# Patient Record
Sex: Female | Born: 1980
Health system: Southern US, Community
[De-identification: ages and names within clinical notes are randomized; demographics above are authoritative.]

## PROBLEM LIST (undated history)

## (undated) DIAGNOSIS — D649 Anemia, unspecified: Secondary | ICD-10-CM

## (undated) DIAGNOSIS — N912 Amenorrhea, unspecified: Secondary | ICD-10-CM

## (undated) DIAGNOSIS — G43909 Migraine, unspecified, not intractable, without status migrainosus: Secondary | ICD-10-CM

## (undated) DIAGNOSIS — C4359 Malignant melanoma of other part of trunk: Secondary | ICD-10-CM

## (undated) DIAGNOSIS — K5900 Constipation, unspecified: Secondary | ICD-10-CM

## (undated) DIAGNOSIS — D693 Immune thrombocytopenic purpura: Secondary | ICD-10-CM

## (undated) HISTORY — DX: Immune thrombocytopenic purpura: D69.3

## (undated) HISTORY — PX: NO PAST SURGERIES: SHX2092

---

## 2015-04-30 ENCOUNTER — Encounter: Payer: Self-pay | Admitting: Internal Medicine

## 2015-04-30 ENCOUNTER — Ambulatory Visit (INDEPENDENT_AMBULATORY_CARE_PROVIDER_SITE_OTHER): Payer: 59 | Admitting: Internal Medicine

## 2015-04-30 ENCOUNTER — Other Ambulatory Visit (INDEPENDENT_AMBULATORY_CARE_PROVIDER_SITE_OTHER): Payer: 59

## 2015-04-30 VITALS — BP 112/74 | HR 68 | Temp 98.0°F | Resp 16 | Ht 63.0 in | Wt 127.0 lb

## 2015-04-30 DIAGNOSIS — F329 Major depressive disorder, single episode, unspecified: Secondary | ICD-10-CM

## 2015-04-30 DIAGNOSIS — D693 Immune thrombocytopenic purpura: Secondary | ICD-10-CM | POA: Diagnosis not present

## 2015-04-30 DIAGNOSIS — R5383 Other fatigue: Secondary | ICD-10-CM

## 2015-04-30 DIAGNOSIS — F411 Generalized anxiety disorder: Secondary | ICD-10-CM | POA: Diagnosis not present

## 2015-04-30 DIAGNOSIS — K900 Celiac disease: Secondary | ICD-10-CM

## 2015-04-30 DIAGNOSIS — F32A Depression, unspecified: Secondary | ICD-10-CM | POA: Insufficient documentation

## 2015-04-30 DIAGNOSIS — Z299 Encounter for prophylactic measures, unspecified: Secondary | ICD-10-CM

## 2015-04-30 LAB — COMPREHENSIVE METABOLIC PANEL
ALBUMIN: 4.5 g/dL (ref 3.5–5.2)
ALT: 16 U/L (ref 0–35)
AST: 17 U/L (ref 0–37)
Alkaline Phosphatase: 42 U/L (ref 39–117)
BILIRUBIN TOTAL: 0.5 mg/dL (ref 0.2–1.2)
BUN: 13 mg/dL (ref 6–23)
CALCIUM: 9.8 mg/dL (ref 8.4–10.5)
CO2: 29 meq/L (ref 19–32)
Chloride: 103 mEq/L (ref 96–112)
Creatinine, Ser: 0.74 mg/dL (ref 0.40–1.20)
GFR: 96.46 mL/min (ref >60.00)
Glucose, Bld: 73 mg/dL (ref 70–99)
Potassium: 4.1 mEq/L (ref 3.5–5.1)
Sodium: 139 mEq/L (ref 135–145)
Total Protein: 7.7 g/dL (ref 6.0–8.3)

## 2015-04-30 LAB — CBC WITH DIFFERENTIAL/PLATELET
BASOS ABS: 0 10*3/uL (ref 0.0–0.1)
BASOS PCT: 0.7 % (ref 0.0–3.0)
Eosinophils Absolute: 0.1 10*3/uL (ref 0.0–0.7)
Eosinophils Relative: 3.1 % (ref 0.0–5.0)
HEMATOCRIT: 38.8 % (ref 36.0–46.0)
HEMOGLOBIN: 13 g/dL (ref 12.0–15.0)
Lymphocytes Relative: 31.7 % (ref 12.0–46.0)
Lymphs Abs: 1.5 10*3/uL (ref 0.7–4.0)
MCHC: 33.5 g/dL (ref 30.0–36.0)
MCV: 87.2 fl (ref 78.0–100.0)
MONOS PCT: 6.9 % (ref 3.0–12.0)
Monocytes Absolute: 0.3 10*3/uL (ref 0.1–1.0)
NEUTROS ABS: 2.7 10*3/uL (ref 1.4–7.7)
Neutrophils Relative %: 57.6 % (ref 43.0–77.0)
PLATELETS: 147 10*3/uL — AB (ref 150.0–400.0)
RBC: 4.45 Mil/uL (ref 3.87–5.11)
RDW: 12.9 % (ref 11.5–15.5)
WBC: 4.7 10*3/uL (ref 4.0–10.5)

## 2015-04-30 LAB — TSH: TSH: 0.66 u[IU]/mL (ref 0.35–4.50)

## 2015-04-30 LAB — VITAMIN B12: Vitamin B-12: 1133 pg/mL — ABNORMAL HIGH (ref 211–911)

## 2015-04-30 MED ORDER — ESCITALOPRAM OXALATE 10 MG PO TABS: 10.0000 mg | ORAL_TABLET | Freq: Every day | ORAL | Status: DC

## 2015-04-30 NOTE — Assessment & Plan Note (Signed)
Has seen GI and celiac testing pending Symptoms including headaches, stomach upset improved with elimination diet On Linzess for constipation Will check B12 level

## 2015-04-30 NOTE — Patient Instructions (Signed)
   Test(s) ordered today. Your results will be released to [place] (or called to you) after review, usually within 72hours after test completion. If any changes need to be made, you will be notified at that same time.  All other Health Maintenance issues reviewed.   All recommended immunizations and age-appropriate screenings are up-to-date.  No immunizations administered today.   Medications reviewed and updated. Will start lexapro 10 mg daily today.  We will have you come back in about 4 weeks to see how the medicaition is working.   Your prescription(s) have been submitted to your pharmacy. Please take as directed and contact our office if you believe you are having problem(s) with the medication(s).  Please schedule followup in 4 weeks

## 2015-04-30 NOTE — Assessment & Plan Note (Signed)
Fatigue for years, unknown cause Depression/anxiety may be contributing - will start medication for that Will check basic blood work to rule out other causes of fatigue

## 2015-04-30 NOTE — Progress Notes (Signed)
Subjective:    Patient ID: Ashley Barajas, female    DOB: Nov 02, 1980, 34 y.o.   MRN: [id number]  HPI She is here to establish with a new pcp. She is concerned about fatigue and depression.  Fatigue, depression: she has extreme fatigue and just wants to sit and close her eyes.  She thinks it started years ago.  She gets more overwhelmed too.  She gets anxious and irritable at time - sometimes daily.   She is unsure if the fatigue is related to depression or anxiety. She thinks the fatigue may have even started after the birth of her second son. She does exercise.  Possible gluten sensitivity:  She has done an elimination diet and feels she is sensitive to gluten. She recently saw a gastroenterologist and believe she was tested for it but does not know the results.She suffers with chronic constipation and just started taking Linzess for it. She is unsure if medication is effective because she has not been on it long.  She has not seen a gynecologist in approximately 3 years, but has an appointment. She does not have regular periods. She will occasionally spot, but has not had a period in a long time. She thinks everything changed after the birth of her second son.  ITP: She has story of ITP. This first occurred in 1998 or 1999.  There was no known cause.  She has had 3 episodes which she needed to be treated with prednisone.  She denies any excessive bruising or bleeding at this time.   Medications and allergies reviewed with patient and updated if appropriate.  Patient Active Problem List   Diagnosis Date Noted  . 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 Depression 04/30/2015  . Fatigue 04/30/2015    Past Medical History  Diagnosis Date  . ITP (idiopathic thrombocytopenic purpura)     History reviewed. No pertinent past surgical history.  Social History   Social History  . Marital Status: Married    Spouse Name: N/A  . Number of Children: 2  . Years of Education:  N/A   Social History Main Topics  . Smoking status: Former Research scientist (life sciences)  . Smokeless tobacco: Never Used  . Alcohol Use: No  . Drug Use: No  . Sexual Activity: Not Asked   Other Topics Concern  . None   Social History Narrative   Nurse - long term care      Two children: 6,11    Review of Systems  Constitutional: Positive for fatigue and unexpected weight change (gained 13 lbs in last year). Negative for fever, chills and appetite change.  HENT: Negative for congestion, ear pain, hearing loss and sore throat.        Right ear pops, pressure intermittently  Eyes: Negative for visual disturbance.  Respiratory: Negative for cough, shortness of breath and wheezing.   Cardiovascular: Positive for palpitations (with anxiety).  Gastrointestinal: Positive for abdominal pain (related to constipation).  Genitourinary: Negative for dysuria and hematuria.  Musculoskeletal: Positive for back pain (occasional mid back pain) and arthralgias (occasional hip pain).  Neurological: Positive for headaches (occaisonal migraines, related to diet). Negative for dizziness and light-headedness.  Hematological: Does not bruise/bleed easily.  Psychiatric/Behavioral: Positive for dysphoric mood. Negative for suicidal ideas. The patient is nervous/anxious.        Objective:   Filed Vitals:   04/30/15 0906  BP: 112/74  Pulse: 68  Temp: 98 F (36.7 C)  Resp: 16   Filed Weights  04/30/15 0906  Weight: 127 lb (57.607 kg)   Body mass index is 22.5 kg/(m^2).   Physical Exam  Constitutional: She is oriented to person, place, and time. She appears well-developed and well-nourished. No distress.  HENT:  Head: Normocephalic and atraumatic.  Right Ear: External ear normal.  Left Ear: External ear normal.  Mouth/Throat: Oropharynx is clear and moist.  Eyes: Conjunctivae are normal.  Neck: Neck supple. No tracheal deviation present. No thyromegaly present.  Cardiovascular: Normal rate, regular rhythm and  normal heart sounds.   No murmur heard. Pulmonary/Chest: Effort normal. No respiratory distress. She has no wheezes.  Abdominal: Soft. Bowel sounds are normal. She exhibits no distension. There is tenderness (mild tenderness across mid abdomen). There is no rebound and no guarding.  Musculoskeletal: She exhibits no edema.  Lymphadenopathy:    She has no cervical adenopathy.  Neurological: She is alert and oriented to person, place, and time.  Skin: Skin is dry. No rash noted.  Psychiatric: She has a normal mood and affect. Her behavior is normal.       Assessment & Plan:   Up to date on vaccines through work Has gyn appt scheduled Consented to Hiv test  See Problem List.   Binnie Rail, MD

## 2015-04-30 NOTE — Assessment & Plan Note (Signed)
History of ITP needing prednisone three times No current symptoms, but has not had blood work done in a while Will check cbc She knows to monitor for excessive bruising, bleeding

## 2015-04-30 NOTE — Assessment & Plan Note (Signed)
Feeling depressed and anxious Feels overwhelmed and irritable at times Is interested in treatment, declined therapy Start lexapro 10 mg daily Discussed potential side effects Follow up in 4 weeks, sooner if needed

## 2015-04-30 NOTE — Assessment & Plan Note (Signed)
Feels depressed and it may be contributing to her fatigue Is interested in treatment, declined therapy Start lexapro 10 mg daily Discussed potential side effects Follow up in 4 weeks, sooner if needed

## 2015-05-01 LAB — HIV ANTIBODY (ROUTINE TESTING W REFLEX): HIV: NONREACTIVE

## 2015-05-04 ENCOUNTER — Telehealth: Payer: 59 | Admitting: Family

## 2015-05-04 DIAGNOSIS — J069 Acute upper respiratory infection, unspecified: Secondary | ICD-10-CM

## 2015-05-04 MED ORDER — FLUTICASONE PROPIONATE 50 MCG/ACT NA SUSP: 1.0000 | Freq: Two times a day (BID) | NASAL | Status: DC

## 2015-05-04 NOTE — Progress Notes (Signed)
We are sorry that you are not feeling well.  Here is how we plan to help!  Based on what you have shared with me it looks like you have sinusitis.  Sinusitis is inflammation and infection in the sinus cavities of the head.  Based on your presentation I believe you most likely have Acute Viral Sinusitis. This is an infection most likely caused by a virus.  There is not specific treatment for viral sinusitis other than to help you with the symptoms until the infection runs it's course.  You may use an oral decongestant such as Mucinex D or if you have glaucoma or high blood pressure use plain Mucinex.  Saline nasal spray help and can safely be used as often as needed for congestion, I have prescribed fluticason nasal spray. Spray two sprays in each nostril twice a day to help reduce your symptoms.  Some authorities believe that zinc sprays or the use of Echinacea may shorten the course of your symptoms.  Sinus infections are not as easily transmitted as other respiratory infection, however we still recommend that you avoid close contact with loved ones, especially the very young and elderly.  Remember to wash your hands thoroughly throughout the day as this is the number one way to prevent the spread of infection!  Home Care:  Only take medications as instructed by your medical team.  Complete the entire course of an antibiotic.  Do not take these medications with alcohol.  A steam or ultrasonic humidifier can help congestion.  You can place a towel over your head and breathe in the steam from hot water coming from a faucet.  Avoid close contacts especially the very young and the elderly.  Cover your mouth when you cough or sneeze.  Always remember to wash your hands.  Get Help Right Away If:  You develop worsening fever or sinus pain.  You develop a severe head ache or visual changes.  Your symptoms persist after you have completed your treatment plan.  Make sure you  Understand these  instructions.  Will watch your condition.  Will get help right away if you are not doing well or get worse.  Your e-visit answers were reviewed by a board certified advanced clinical practitioner to complete your personal care plan.  Depending on the condition, your plan could have included both over the counter or prescription medications.  If there is a problem please reply  once you have received a response from your provider.  Your safety is important to us.  If you have drug allergies check your prescription carefully.    You can use MyChart to ask questions about today's visit, request a non-urgent call back, or ask for a work or school excuse for 24 hours related to this e-Visit. If it has been greater than 24 hours you will need to follow up with your provider, or enter a new e-Visit to address those concerns.  You will get an e-mail in the next two days asking about your experience.  I hope that your e-visit has been valuable and will speed your recovery. Thank you for using e-visits.    

## 2015-05-07 ENCOUNTER — Telehealth: Payer: 59 | Admitting: Family

## 2015-05-07 DIAGNOSIS — J019 Acute sinusitis, unspecified: Secondary | ICD-10-CM | POA: Diagnosis not present

## 2015-05-07 MED ORDER — AMOXICILLIN-POT CLAVULANATE 875-125 MG PO TABS: 1.0000 | ORAL_TABLET | Freq: Two times a day (BID) | ORAL | Status: DC

## 2015-05-07 NOTE — Progress Notes (Signed)

## 2015-05-23 ENCOUNTER — Encounter: Payer: Self-pay | Admitting: Internal Medicine

## 2015-05-23 ENCOUNTER — Other Ambulatory Visit: Payer: Self-pay | Admitting: Internal Medicine

## 2015-05-23 ENCOUNTER — Ambulatory Visit (INDEPENDENT_AMBULATORY_CARE_PROVIDER_SITE_OTHER): Payer: 59 | Admitting: Internal Medicine

## 2015-05-23 VITALS — BP 114/74 | HR 63 | Temp 98.3°F | Resp 16 | Wt 125.0 lb

## 2015-05-23 DIAGNOSIS — F32A Depression, unspecified: Secondary | ICD-10-CM

## 2015-05-23 DIAGNOSIS — R5383 Other fatigue: Secondary | ICD-10-CM

## 2015-05-23 DIAGNOSIS — F329 Major depressive disorder, single episode, unspecified: Secondary | ICD-10-CM

## 2015-05-23 DIAGNOSIS — F411 Generalized anxiety disorder: Secondary | ICD-10-CM

## 2015-05-23 NOTE — Patient Instructions (Signed)
Continue your current medications.  Call or send a mychart message if you have questions or if you feel you may want to make a change to the lexapro.   Follow up in 6 months, sooner if needed.

## 2015-05-23 NOTE — Assessment & Plan Note (Signed)
Improved with lexapro 10 mg daily - she would like to continue with her current dose Less feeling of being overwhelmed, less worry and less anxiety in general Will let me know if she feels she wants to increase the dose of the lexapro in the next couple of weeks Follow up in 6 months, sooner if needed

## 2015-05-23 NOTE — Progress Notes (Signed)
    Subjective:    Patient ID: Ashley Barajas, female    DOB: [birth year], 34 y.o.   MRN: [id number]  HPI She is here for follow up of depression and anxiety.  She started the lexapro about 4 weeks ago.  She has had increased headaches, but is unsure if the headaches are related to the lexapro or something else.  Sometimes advil works for the headache.  She still feels fatigued and drained all the time, but this is chronic.  She denies any change in her fatigue.  She slept 8 hours last night and feels fatigued.    She thinks the lexapro has helped the feelig of being overwhelemd.  Her anxiety is a little better and she is worrying less.  She also feels her depression is better.  She is unsure if she needs a higher dose of medication or not.     Medications and allergies reviewed with patient and updated if appropriate.  Patient Active Problem List   Diagnosis Date Noted  . 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 Depression 04/30/2015  . Fatigue 04/30/2015  . Transient gluten sensitivity 04/30/2015  . Generalized anxiety disorder 04/30/2015    Current Outpatient Prescriptions on File Prior to Visit  Medication Sig Dispense Refill  . escitalopram (LEXAPRO) 10 MG tablet Take 1 tablet (10 mg total) by mouth daily. 30 tablet 5  . Fexofenadine HCl (ALLEGRA PO) Take by mouth as needed.    . fluticasone (FLONASE) 50 MCG/ACT nasal spray Place 1-2 sprays into both nostrils 2 (two) times daily. 16 g 6  . Probiotic Product (PROBIOTIC ADVANCED PO) Take by mouth.     No current facility-administered medications on file prior to visit.    Past Medical History  Diagnosis Date  . ITP (idiopathic thrombocytopenic purpura)     No past surgical history on file.  Social History   Social History  . Marital Status: Married    Spouse Name: N/A  . Number of Children: 2  . Years of Education: N/A   Social History Main Topics  . Smoking status: Former Research scientist (life sciences)  . Smokeless tobacco:  Never Used  . Alcohol Use: No  . Drug Use: No  . Sexual Activity: Not Asked   Other Topics Concern  . 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 Negative for appetite change and unexpected weight change.  Cardiovascular: Negative for chest pain and palpitations.  Gastrointestinal: Negative for nausea.  Neurological: Positive for headaches (generalized). Negative for dizziness and light-headedness.  Psychiatric/Behavioral: Negative for suicidal ideas and sleep disturbance.       Objective:   Filed Vitals:   05/23/15 0932  BP: 114/74  Pulse: 63  Temp: 98.3 F (36.8 C)  Resp: 16   Filed Weights   05/23/15 0932  Weight: 125 lb (56.7 kg)   Body mass index is 22.15 kg/(m^2).   Physical Exam  Constitutional: She is oriented to person, place, and time. She appears well-developed and well-nourished. No distress.  Neurological: She is alert and oriented to person, place, and time.  Psychiatric: She has a normal mood and affect. Her behavior is normal. Judgment normal.          Assessment & Plan:   Blood work reviewed  See Problem List.   Follow up in 6 months sooner if needed

## 2015-05-23 NOTE — Assessment & Plan Note (Signed)
Chronic, blood work reveals no cause Continues good sleep and regular exercise Try adding vitamin D 2000 units daily Unlikely related to depression, anxiety since those have improved with medication

## 2015-05-23 NOTE — Assessment & Plan Note (Signed)
Improved with lexapro 10 mg daily - she would like to continue with her current dose Will let me know if she feels she wants to increase the dose of the lexapro in the next couple of weeks Follow up in 6 months, sooner if needed

## 2015-05-23 NOTE — Progress Notes (Signed)
Pre visit review using our clinic review tool, if applicable. No additional management support is needed unless otherwise documented below in the visit note. 

## 2015-06-26 ENCOUNTER — Encounter: Payer: Self-pay | Admitting: Internal Medicine

## 2015-06-26 DIAGNOSIS — R5382 Chronic fatigue, unspecified: Secondary | ICD-10-CM

## 2015-06-26 DIAGNOSIS — R51 Headache: Secondary | ICD-10-CM

## 2015-06-26 DIAGNOSIS — R519 Headache, unspecified: Secondary | ICD-10-CM

## 2015-07-02 ENCOUNTER — Other Ambulatory Visit: Payer: 59

## 2015-07-02 DIAGNOSIS — R5382 Chronic fatigue, unspecified: Secondary | ICD-10-CM

## 2015-07-02 DIAGNOSIS — G9332 Myalgic encephalomyelitis/chronic fatigue syndrome: Secondary | ICD-10-CM

## 2015-07-02 DIAGNOSIS — R519 Headache, unspecified: Secondary | ICD-10-CM

## 2015-07-02 DIAGNOSIS — R51 Headache: Secondary | ICD-10-CM

## 2015-07-03 ENCOUNTER — Encounter: Payer: Self-pay | Admitting: Internal Medicine

## 2015-07-03 LAB — ANA: ANA: NEGATIVE

## 2015-07-03 LAB — LYME DISEASE DNA BY PCR(BORRELIA BURG): B burgdorferi DNA: NOT DETECTED

## 2015-08-02 ENCOUNTER — Ambulatory Visit: Payer: Self-pay | Admitting: Family Medicine

## 2015-08-02 DIAGNOSIS — J3081 Allergic rhinitis due to animal (cat) (dog) hair and dander: Secondary | ICD-10-CM | POA: Diagnosis not present

## 2015-08-02 DIAGNOSIS — J3089 Other allergic rhinitis: Secondary | ICD-10-CM | POA: Diagnosis not present

## 2015-08-02 DIAGNOSIS — J301 Allergic rhinitis due to pollen: Secondary | ICD-10-CM | POA: Diagnosis not present

## 2015-08-07 DIAGNOSIS — J3089 Other allergic rhinitis: Secondary | ICD-10-CM | POA: Diagnosis not present

## 2015-08-07 DIAGNOSIS — J301 Allergic rhinitis due to pollen: Secondary | ICD-10-CM | POA: Diagnosis not present

## 2015-08-07 DIAGNOSIS — J3081 Allergic rhinitis due to animal (cat) (dog) hair and dander: Secondary | ICD-10-CM | POA: Diagnosis not present

## 2015-08-16 DIAGNOSIS — J301 Allergic rhinitis due to pollen: Secondary | ICD-10-CM | POA: Diagnosis not present

## 2015-08-16 DIAGNOSIS — J3081 Allergic rhinitis due to animal (cat) (dog) hair and dander: Secondary | ICD-10-CM | POA: Diagnosis not present

## 2015-08-16 DIAGNOSIS — J3089 Other allergic rhinitis: Secondary | ICD-10-CM | POA: Diagnosis not present

## 2015-08-21 DIAGNOSIS — J3089 Other allergic rhinitis: Secondary | ICD-10-CM | POA: Diagnosis not present

## 2015-08-21 DIAGNOSIS — J3081 Allergic rhinitis due to animal (cat) (dog) hair and dander: Secondary | ICD-10-CM | POA: Diagnosis not present

## 2015-08-21 DIAGNOSIS — J301 Allergic rhinitis due to pollen: Secondary | ICD-10-CM | POA: Diagnosis not present

## 2015-08-24 DIAGNOSIS — J3081 Allergic rhinitis due to animal (cat) (dog) hair and dander: Secondary | ICD-10-CM | POA: Diagnosis not present

## 2015-08-24 DIAGNOSIS — J301 Allergic rhinitis due to pollen: Secondary | ICD-10-CM | POA: Diagnosis not present

## 2015-08-24 DIAGNOSIS — J3089 Other allergic rhinitis: Secondary | ICD-10-CM | POA: Diagnosis not present

## 2015-08-29 ENCOUNTER — Ambulatory Visit (INDEPENDENT_AMBULATORY_CARE_PROVIDER_SITE_OTHER): Payer: 59 | Admitting: Nurse Practitioner

## 2015-08-29 ENCOUNTER — Encounter: Payer: Self-pay | Admitting: Nurse Practitioner

## 2015-08-29 VITALS — BP 110/78 | HR 61 | Temp 98.4°F | Ht 63.0 in | Wt 138.0 lb

## 2015-08-29 DIAGNOSIS — L03011 Cellulitis of right finger: Secondary | ICD-10-CM

## 2015-08-29 DIAGNOSIS — IMO0001 Reserved for inherently not codable concepts without codable children: Secondary | ICD-10-CM

## 2015-08-29 MED ORDER — AMOXICILLIN-POT CLAVULANATE 875-125 MG PO TABS: 1.0000 | ORAL_TABLET | Freq: Two times a day (BID) | ORAL | Status: DC

## 2015-08-29 NOTE — Progress Notes (Signed)
Patient ID: Ashley Barajas, female    DOB: [birth year]  Age: 35 y.o. MRN: [id number]  CC: Hand Pain   HPI Ashley Barajas presents for CC of hand pain of right hand.  1) Swelling and redness at nail cuticle 2nd finger of right hand Denies discharge or bleeding Very tender  "Feels heart beat in finger tip" No treatment to date   History Ashley Barajas has a past medical history of ITP (idiopathic thrombocytopenic purpura).   She has no past surgical history on file.   Her family history includes Cancer in her son; Hypertension in her father and mother.She reports that she has quit smoking. She has never used smokeless tobacco. She reports that she does not drink alcohol or use illicit drugs.  Outpatient Prescriptions Prior to Visit  Medication Sig Dispense Refill  . escitalopram (LEXAPRO) 10 MG tablet Take 1 tablet (10 mg total) by mouth daily. 30 tablet 5  . Fexofenadine HCl (ALLEGRA PO) Take by mouth as needed.    . fluticasone (FLONASE) 50 MCG/ACT nasal spray Place 1-2 sprays into both nostrils 2 (two) times daily. 16 g 6  . LINZESS 290 MCG CAPS capsule   3  . Probiotic Product (PROBIOTIC ADVANCED PO) Take by mouth.     No facility-administered medications prior to visit.    ROS Review of Systems  Constitutional: Negative for fever, chills, diaphoresis and fatigue.  Respiratory: Negative for chest tightness, shortness of breath and wheezing.   Cardiovascular: Negative for chest pain, palpitations and leg swelling.  Gastrointestinal: Negative for nausea, vomiting and diarrhea.  Skin: Negative for rash.  Neurological: Negative for dizziness and headaches.    Objective:  BP 110/78 mmHg  Pulse 61  Temp(Src) 98.4 F (36.9 C) (Oral)  Ht 5\' 3"  (1.6 m)  Wt 138 lb (62.596 kg)  BMI 24.45 kg/m2  SpO2 99%  LMP   Physical Exam  Constitutional: She is oriented to person, place, and time. She appears well-developed and well-nourished. No distress.  HENT:  Head: Normocephalic and atraumatic.   Right Ear: External ear normal.  Left Ear: External ear normal.  Cardiovascular: Normal rate, regular rhythm and normal heart sounds.  Exam reveals no gallop and no friction rub.   No murmur heard. Pulmonary/Chest: Effort normal and breath sounds normal. No respiratory distress. She has no wheezes. She has no rales. She exhibits no tenderness.  Musculoskeletal: She exhibits edema and tenderness.       Hands: 2nd finger cuticle is inflamed (paronychia), no site of puncture or drainage   Neurological: She is alert and oriented to person, place, and time.  Skin: Skin is warm and dry. No rash noted. She is not diaphoretic. There is erythema.  Psychiatric: She has a normal mood and affect. Her behavior is normal. Judgment and thought content normal.   Assessment & Plan:   Ashley Barajas was seen today for hand pain.  Diagnoses and all orders for this visit:  Paronychia of second finger of right hand  Other orders -     amoxicillin-clavulanate (AUGMENTIN) 875-125 MG tablet; Take 1 tablet by mouth 2 (two) times daily.   I am having Ashley Barajas start on amoxicillin-clavulanate. I am also having her maintain her Fexofenadine HCl (ALLEGRA PO), Probiotic Product (PROBIOTIC ADVANCED PO), escitalopram, fluticasone, LINZESS, Norethindrone-Ethinyl Estradiol-Fe Biphas, montelukast, and EPIPEN 2-PAK.  Meds ordered this encounter  Medications  . Norethindrone-Ethinyl Estradiol-Fe Biphas (LO LOESTRIN FE) 1 MG-10 MCG / 10 MCG tablet    Sig: Take 1 tablet by mouth daily.  Marland Kitchen  montelukast (SINGULAIR) 10 MG tablet    Sig:     Refill:  5  . EPIPEN 2-PAK 0.3 MG/0.3ML SOAJ injection    Sig: Reported on 08/29/2015    Refill:  1  . amoxicillin-clavulanate (AUGMENTIN) 875-125 MG tablet    Sig: Take 1 tablet by mouth 2 (two) times daily.    Dispense:  14 tablet    Refill:  0    Order Specific Question:  Supervising Provider    Answer:  Crecencio Mc [[id number]]     Follow-up: Return if symptoms worsen or fail to  improve.

## 2015-08-29 NOTE — Patient Instructions (Signed)
Fingertip Infection When an infection is around the nail, it is called a paronychia. When it appears over the tip of the finger, it is called a felon. These infections are due to minor injuries or cracks in the skin. If they are not treated properly, they can lead to bone infection and permanent damage to the fingernail. Incision and drainage is necessary if a pus pocket (an abscess) has formed. Antibiotics and pain medicine may also be needed. Keep your hand elevated for the next 2-3 days to reduce swelling and pain. If a pack was placed in the abscess, it should be removed in 1-2 days by your caregiver. Soak the finger in warm water for 20 minutes 4 times daily to help promote drainage. Keep the hands as dry as possible. Wear protective gloves with cotton liners. See your caregiver for follow-up care as recommended.  HOME CARE INSTRUCTIONS   Keep wound clean, dry and dressed as suggested by your caregiver.  Soak in warm salt water for fifteen minutes, four times per day for bacterial infections.  Your caregiver will prescribe an antibiotic if a bacterial infection is suspected. Take antibiotics as directed and finish the prescription, even if the problem appears to be improving before the medicine is gone.  Only take over-the-counter or prescription medicines for pain, discomfort, or fever as directed by your caregiver. SEEK IMMEDIATE MEDICAL CARE IF:  There is redness, swelling, or increasing pain in the wound.  Pus or any other unusual drainage is coming from the wound.  An unexplained oral temperature above 102 F (38.9 C) develops.  You notice a foul smell coming from the wound or dressing. MAKE SURE YOU:   Understand these instructions.  Monitor your condition.  Contact your caregiver if you are getting worse or not improving.   This information is not intended to replace advice given to you by your health care provider. Make sure you discuss any questions you have with your  health care provider.   Document Released: 08/07/2004 Document Revised: 09/22/2011 Document Reviewed: 12/18/2014 Elsevier Interactive Patient Education 2016 [employer]  

## 2015-08-29 NOTE — Progress Notes (Signed)
Pre visit review using our clinic review tool, if applicable. No additional management support is needed unless otherwise documented below in the visit note. 

## 2015-08-30 DIAGNOSIS — J301 Allergic rhinitis due to pollen: Secondary | ICD-10-CM | POA: Diagnosis not present

## 2015-08-30 DIAGNOSIS — J3089 Other allergic rhinitis: Secondary | ICD-10-CM | POA: Diagnosis not present

## 2015-08-30 DIAGNOSIS — J3081 Allergic rhinitis due to animal (cat) (dog) hair and dander: Secondary | ICD-10-CM | POA: Diagnosis not present

## 2015-08-31 DIAGNOSIS — IMO0001 Reserved for inherently not codable concepts without codable children: Secondary | ICD-10-CM | POA: Insufficient documentation

## 2015-08-31 NOTE — Assessment & Plan Note (Signed)
Augmentin is treatment of choice- sent to pharmacy Asked her to use warm wet compresses a couple times a day  Handout given for paronychia home instructions  Has bacitracin at home if drainage occurs

## 2015-09-04 DIAGNOSIS — J3081 Allergic rhinitis due to animal (cat) (dog) hair and dander: Secondary | ICD-10-CM | POA: Diagnosis not present

## 2015-09-04 DIAGNOSIS — J3089 Other allergic rhinitis: Secondary | ICD-10-CM | POA: Diagnosis not present

## 2015-09-04 DIAGNOSIS — J301 Allergic rhinitis due to pollen: Secondary | ICD-10-CM | POA: Diagnosis not present

## 2015-09-07 DIAGNOSIS — J3081 Allergic rhinitis due to animal (cat) (dog) hair and dander: Secondary | ICD-10-CM | POA: Diagnosis not present

## 2015-09-07 DIAGNOSIS — J301 Allergic rhinitis due to pollen: Secondary | ICD-10-CM | POA: Diagnosis not present

## 2015-09-07 DIAGNOSIS — J3089 Other allergic rhinitis: Secondary | ICD-10-CM | POA: Diagnosis not present

## 2015-09-13 DIAGNOSIS — J3081 Allergic rhinitis due to animal (cat) (dog) hair and dander: Secondary | ICD-10-CM | POA: Diagnosis not present

## 2015-09-13 DIAGNOSIS — J301 Allergic rhinitis due to pollen: Secondary | ICD-10-CM | POA: Diagnosis not present

## 2015-09-13 DIAGNOSIS — J3089 Other allergic rhinitis: Secondary | ICD-10-CM | POA: Diagnosis not present

## 2015-09-18 DIAGNOSIS — J301 Allergic rhinitis due to pollen: Secondary | ICD-10-CM | POA: Diagnosis not present

## 2015-09-18 DIAGNOSIS — J3089 Other allergic rhinitis: Secondary | ICD-10-CM | POA: Diagnosis not present

## 2015-09-18 DIAGNOSIS — J3081 Allergic rhinitis due to animal (cat) (dog) hair and dander: Secondary | ICD-10-CM | POA: Diagnosis not present

## 2015-09-21 DIAGNOSIS — J3081 Allergic rhinitis due to animal (cat) (dog) hair and dander: Secondary | ICD-10-CM | POA: Diagnosis not present

## 2015-09-21 DIAGNOSIS — J3089 Other allergic rhinitis: Secondary | ICD-10-CM | POA: Diagnosis not present

## 2015-09-21 DIAGNOSIS — J301 Allergic rhinitis due to pollen: Secondary | ICD-10-CM | POA: Diagnosis not present

## 2015-09-27 DIAGNOSIS — J3081 Allergic rhinitis due to animal (cat) (dog) hair and dander: Secondary | ICD-10-CM | POA: Diagnosis not present

## 2015-09-27 DIAGNOSIS — J3089 Other allergic rhinitis: Secondary | ICD-10-CM | POA: Diagnosis not present

## 2015-09-27 DIAGNOSIS — J301 Allergic rhinitis due to pollen: Secondary | ICD-10-CM | POA: Diagnosis not present

## 2015-10-02 DIAGNOSIS — J3089 Other allergic rhinitis: Secondary | ICD-10-CM | POA: Diagnosis not present

## 2015-10-02 DIAGNOSIS — J301 Allergic rhinitis due to pollen: Secondary | ICD-10-CM | POA: Diagnosis not present

## 2015-10-02 DIAGNOSIS — J3081 Allergic rhinitis due to animal (cat) (dog) hair and dander: Secondary | ICD-10-CM | POA: Diagnosis not present

## 2015-10-05 DIAGNOSIS — J301 Allergic rhinitis due to pollen: Secondary | ICD-10-CM | POA: Diagnosis not present

## 2015-10-05 DIAGNOSIS — J3081 Allergic rhinitis due to animal (cat) (dog) hair and dander: Secondary | ICD-10-CM | POA: Diagnosis not present

## 2015-10-05 DIAGNOSIS — J3089 Other allergic rhinitis: Secondary | ICD-10-CM | POA: Diagnosis not present

## 2015-10-16 DIAGNOSIS — J3081 Allergic rhinitis due to animal (cat) (dog) hair and dander: Secondary | ICD-10-CM | POA: Diagnosis not present

## 2015-10-16 DIAGNOSIS — J3089 Other allergic rhinitis: Secondary | ICD-10-CM | POA: Diagnosis not present

## 2015-10-16 DIAGNOSIS — J301 Allergic rhinitis due to pollen: Secondary | ICD-10-CM | POA: Diagnosis not present

## 2015-10-19 DIAGNOSIS — Z3009 Encounter for other general counseling and advice on contraception: Secondary | ICD-10-CM | POA: Diagnosis not present

## 2015-10-19 DIAGNOSIS — N926 Irregular menstruation, unspecified: Secondary | ICD-10-CM | POA: Diagnosis not present

## 2015-10-30 DIAGNOSIS — J3089 Other allergic rhinitis: Secondary | ICD-10-CM | POA: Diagnosis not present

## 2015-10-30 DIAGNOSIS — J301 Allergic rhinitis due to pollen: Secondary | ICD-10-CM | POA: Diagnosis not present

## 2015-10-30 DIAGNOSIS — J3081 Allergic rhinitis due to animal (cat) (dog) hair and dander: Secondary | ICD-10-CM | POA: Diagnosis not present

## 2015-11-02 DIAGNOSIS — Z30430 Encounter for insertion of intrauterine contraceptive device: Secondary | ICD-10-CM | POA: Diagnosis not present

## 2015-11-02 DIAGNOSIS — Z3200 Encounter for pregnancy test, result unknown: Secondary | ICD-10-CM | POA: Diagnosis not present

## 2015-11-12 ENCOUNTER — Encounter: Payer: Self-pay | Admitting: Internal Medicine

## 2015-11-12 ENCOUNTER — Ambulatory Visit (INDEPENDENT_AMBULATORY_CARE_PROVIDER_SITE_OTHER): Payer: 59 | Admitting: Internal Medicine

## 2015-11-12 VITALS — BP 124/84 | HR 63 | Temp 98.5°F | Resp 16 | Wt 139.0 lb

## 2015-11-12 DIAGNOSIS — M79604 Pain in right leg: Secondary | ICD-10-CM | POA: Insufficient documentation

## 2015-11-12 DIAGNOSIS — F411 Generalized anxiety disorder: Secondary | ICD-10-CM | POA: Diagnosis not present

## 2015-11-12 DIAGNOSIS — F329 Major depressive disorder, single episode, unspecified: Secondary | ICD-10-CM

## 2015-11-12 DIAGNOSIS — M25551 Pain in right hip: Secondary | ICD-10-CM | POA: Insufficient documentation

## 2015-11-12 DIAGNOSIS — R5383 Other fatigue: Secondary | ICD-10-CM | POA: Diagnosis not present

## 2015-11-12 DIAGNOSIS — F32A Depression, unspecified: Secondary | ICD-10-CM

## 2015-11-12 DIAGNOSIS — M79605 Pain in left leg: Secondary | ICD-10-CM

## 2015-11-12 NOTE — Patient Instructions (Signed)
   All other Health Maintenance issues reviewed.   All recommended immunizations and age-appropriate screenings are up-to-date or discussed.  No immunizations administered today.   Medications reviewed and updated.  No changes recommended at this time.    

## 2015-11-12 NOTE — Progress Notes (Signed)
Subjective:    Patient ID: Ashley Barajas, female    DOB: 1980-08-31, 35 y.o.   MRN: [id number]  HPI She is here for follow up.  Anxiety, Depression: She stopped the lexapro.  She did not think she needed it and she had side effects (weight gain, decreased libido).  She was irritable initially after stopping the medication,  but now feels ok.  She still feels anxiety at times, but has been able to deal with it.  She does not feel like she needs medication at this time.    Fatigue:  She still has very low energy.  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  She does not have energy to exercise or do much outside of work.    She does have pain in her right hip, legs and feet. Her hip pain sometimes hurts first thing in the morning.  She denies lower back pain.   She denies any consistent pain with activity or with a certain position.    Medications and allergies reviewed with patient and updated if appropriate.  Patient Active Problem List   Diagnosis Date Noted  . Paronychia of second finger of right hand 08/31/2015  . 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 Depression 04/30/2015  . Fatigue 04/30/2015  . Transient gluten sensitivity 04/30/2015  . Generalized anxiety disorder 04/30/2015    Current Outpatient Prescriptions on File Prior to Visit  Medication Sig Dispense Refill  . EPIPEN 2-PAK 0.3 MG/0.3ML SOAJ injection Reported on 08/29/2015  1  . Fexofenadine HCl (ALLEGRA PO) Take by mouth as needed.    . fluticasone (FLONASE) 50 MCG/ACT nasal spray Place 1-2 sprays into both nostrils 2 (two) times daily. 16 g 6  . LINZESS 290 MCG CAPS capsule   3  . Probiotic Product (PROBIOTIC ADVANCED PO) Take by mouth.     No current facility-administered medications on file prior to visit.    Past Medical History  Diagnosis Date  . ITP (idiopathic thrombocytopenic purpura)     No past surgical history on file.  Social History   Social History    . Marital Status: Married    Spouse Name: N/A  . Number of Children: 2  . Years of Education: N/A   Social History Main Topics  . Smoking status: Former Research scientist (life sciences)  . Smokeless tobacco: Never Used  . Alcohol Use: No  . Drug Use: No  . Sexual Activity: Not Asked   Other Topics Concern  . None   Social History Narrative   Nurse - long term care      Two children: 6,11    Family History  Problem Relation Age of Onset  . Hypertension Mother   . Hypertension Father   . Cancer Son     leukemia    Review of Systems  Constitutional: Negative for fever and chills.  Respiratory: Negative for cough, shortness of breath and wheezing.   Cardiovascular: Negative for chest pain, palpitations and leg swelling.  Gastrointestinal: Positive for abdominal pain (chronic  -constipation, celiac).  Musculoskeletal: Positive for myalgias (leg and feet pain) and arthralgias (right hip). Negative for back pain.  Skin: Negative for rash.  Neurological: Positive for headaches (occ mgraines). Negative for dizziness and light-headedness.       Objective:   Filed Vitals:   11/12/15 1053  BP: 124/84  Pulse: 63  Temp: 98.5 F (36.9 C)  Resp: 16   Filed Weights  11/12/15 1053  Weight: 139 lb (63.05 kg)   Body mass index is 24.63 kg/(m^2).   Physical Exam  Constitutional: She appears well-developed and well-nourished. No distress.  Musculoskeletal: She exhibits no edema.  No muscle tenderness in LE bilaterally, no lateral right hip tenderness with palpation, no pain with passive movement of hip  Skin: She is not diaphoretic.  Psychiatric: She has a normal mood and affect. Her behavior is normal. Judgment and thought content normal.          Assessment & Plan:   See Problem List for Assessment and Plan of chronic medical problems.

## 2015-11-12 NOTE — Assessment & Plan Note (Signed)
Will Dr Tamala Julian for further evaluation

## 2015-11-12 NOTE — Progress Notes (Signed)
Pre visit review using our clinic review tool, if applicable. No additional management support is needed unless otherwise documented below in the visit note. 

## 2015-11-12 NOTE — Assessment & Plan Note (Signed)
Chronic, no cause identified Blood work all normal, no localizing symptoms Advised daily vitamins Try to start exercising regularly Sleep is good, refreshing at this point

## 2015-11-12 NOTE — Assessment & Plan Note (Signed)
Stopped lexapro due to side effects (weight gain, dec libido) Doing ok off medication Will just monitor for now

## 2015-11-27 ENCOUNTER — Ambulatory Visit: Payer: 59 | Admitting: Family Medicine

## 2015-11-27 DIAGNOSIS — J3081 Allergic rhinitis due to animal (cat) (dog) hair and dander: Secondary | ICD-10-CM | POA: Diagnosis not present

## 2015-11-27 DIAGNOSIS — J3089 Other allergic rhinitis: Secondary | ICD-10-CM | POA: Diagnosis not present

## 2015-11-27 DIAGNOSIS — J301 Allergic rhinitis due to pollen: Secondary | ICD-10-CM | POA: Diagnosis not present

## 2015-11-30 DIAGNOSIS — J3081 Allergic rhinitis due to animal (cat) (dog) hair and dander: Secondary | ICD-10-CM | POA: Diagnosis not present

## 2015-11-30 DIAGNOSIS — J3089 Other allergic rhinitis: Secondary | ICD-10-CM | POA: Diagnosis not present

## 2015-11-30 DIAGNOSIS — J301 Allergic rhinitis due to pollen: Secondary | ICD-10-CM | POA: Diagnosis not present

## 2015-12-06 DIAGNOSIS — J301 Allergic rhinitis due to pollen: Secondary | ICD-10-CM | POA: Diagnosis not present

## 2015-12-06 DIAGNOSIS — J3089 Other allergic rhinitis: Secondary | ICD-10-CM | POA: Diagnosis not present

## 2015-12-06 DIAGNOSIS — J3081 Allergic rhinitis due to animal (cat) (dog) hair and dander: Secondary | ICD-10-CM | POA: Diagnosis not present

## 2015-12-11 DIAGNOSIS — J3089 Other allergic rhinitis: Secondary | ICD-10-CM | POA: Diagnosis not present

## 2015-12-11 DIAGNOSIS — J3081 Allergic rhinitis due to animal (cat) (dog) hair and dander: Secondary | ICD-10-CM | POA: Diagnosis not present

## 2015-12-11 DIAGNOSIS — J301 Allergic rhinitis due to pollen: Secondary | ICD-10-CM | POA: Diagnosis not present

## 2015-12-14 DIAGNOSIS — J3081 Allergic rhinitis due to animal (cat) (dog) hair and dander: Secondary | ICD-10-CM | POA: Diagnosis not present

## 2015-12-14 DIAGNOSIS — J3089 Other allergic rhinitis: Secondary | ICD-10-CM | POA: Diagnosis not present

## 2015-12-14 DIAGNOSIS — J301 Allergic rhinitis due to pollen: Secondary | ICD-10-CM | POA: Diagnosis not present

## 2016-01-03 DIAGNOSIS — J3089 Other allergic rhinitis: Secondary | ICD-10-CM | POA: Diagnosis not present

## 2016-01-03 DIAGNOSIS — J3081 Allergic rhinitis due to animal (cat) (dog) hair and dander: Secondary | ICD-10-CM | POA: Diagnosis not present

## 2016-01-03 DIAGNOSIS — J301 Allergic rhinitis due to pollen: Secondary | ICD-10-CM | POA: Diagnosis not present

## 2016-03-26 DIAGNOSIS — H5213 Myopia, bilateral: Secondary | ICD-10-CM | POA: Diagnosis not present

## 2016-05-06 NOTE — Progress Notes (Signed)
[facility] [address] [place], [place] [address] Phone: [phone] Subjective:    I'm seeing this patient by the request  of:  Binnie Rail, MD   CC: Hip and leg pain   [id number]  Ashley Barajas is a 35 y.o. female coming in with complaint of hip and leg pain  Patient states that this pain seems to be mostly of her right hip as well as her legs and feet. Seems to be worse in the first things in the morning. Denies any lower back pain with it. Does not remember any true injury. Past medical history is significant for an idiopathic thrombocytopenic purpura Patient states the most the pain seems to be relocated on the right hip. States that it seems to be on the lateral aspect. Worsens first thing in the morning or after sitting for long amount of time. Seems to get somewhat better with activity. Does not know if it is associated. Sometimes feels that she has cramping in her calves bilaterally. This is very intermittent. Would state that it is less than 2 times a week. Has not notice any specific activities that seem to make it worse.    Past Medical History:  Diagnosis Date  . ITP (idiopathic thrombocytopenic purpura)    No past surgical history on file. Social History   Social History  . Marital status: Married    Spouse name: N/A  . Number of children: 2  . Years of education: N/A   Social History Main Topics  . Smoking status: Former Research scientist (life sciences)  . Smokeless tobacco: Never Used  . Alcohol use No  . Drug use: No  . Sexual activity: Not Asked   Other Topics Concern  . 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 Clarithromycin     Abdominal pain    Family History  Problem Relation Age of Onset  . Hypertension Mother   . Hypertension Father   . Cancer Son     leukemia    Past medical history, social, surgical and family history all reviewed in electronic  medical record.  No pertanent information unless stated regarding to the chief complaint.   Review of Systems: No headache, visual changes, nausea, vomiting, diarrhea, constipation, dizziness, abdominal pain, skin rash, fevers, chills, night sweats, weight loss, swollen lymph nodes, body aches, joint swelling, muscle aches, chest pain, shortness of breath, mood changes.   Objective  Blood pressure 120/80, pulse 72, weight 137 lb (62.1 kg), SpO2 98 %.  General: No apparent distress alert and oriented x3 mood and affect normal, dressed appropriately.  HEENT: Pupils equal, extraocular movements intact  Respiratory: Patient's speak in full sentences and does not appear short of breath  Cardiovascular: No lower extremity edema, non tender, no erythema  Skin: Warm dry intact with no signs of infection or rash on extremities or on axial skeleton.  Abdomen: Soft nontender  Neuro: Cranial nerves II through XII are intact, neurovascularly intact in all extremities with 2+ DTRs and 2+ pulses.  Lymph: No lymphadenopathy of posterior or anterior cervical chain or axillae bilaterally.  Gait normal with good balance and coordination.  MSK:  Non tender with full range of motion and good stability and symmetric strength and tone of shoulders, elbows, wrist,  knee and ankles bilaterally.  Hip: Right ROM IR: 25 Deg, ER: 35 Deg, Flexion: 120 Deg,  Extension: 100 Deg, Abduction: 45 Deg, Adduction: 25 Deg Strength IR: 5/5, ER: 5/5, Flexion: 5/5, Extension: 5/5, Abduction: 5/5, Adduction: 4/5 weaker than the contralateral side Pelvic alignment unremarkable to inspection and palpation. Standing hip rotation and gait without trendelenburg sign / unsteadiness. Greater trochanter with moderate to severe tenderness over the greater trochanter. No tenderness over piriformis. Positive Faber No SI joint tenderness and normal minimal SI movement.  Procedure note [id number]; 15 minutes spent for Therapeutic exercises as  stated in above notes.  This included exercises focusing on stretching, strengthening, with significant focus on eccentric aspects. Hip strengthening exercises which included:  Pelvic tilt/bracing to help with proper recruitment of the lower abs and pelvic floor muscles  Glute strengthening to properly contract glutes without over-engaging low back and hamstrings - prone hip extension and glute bridge exercises Proper stretching techniques to increase effectiveness for the hip flexors, groin, quads, piriformic and low back when appropriate      Proper technique shown and discussed handout in great detail with ATC.  All questions were discussed and answered.     Impression and Recommendations:     This case required medical decision making of moderate complexity.      Note: This dictation was prepared with Dragon dictation along with smaller phrase technology. Any transcriptional errors that result from this process are unintentional.

## 2016-05-07 ENCOUNTER — Encounter: Payer: Self-pay | Admitting: Family Medicine

## 2016-05-07 ENCOUNTER — Ambulatory Visit (INDEPENDENT_AMBULATORY_CARE_PROVIDER_SITE_OTHER): Payer: 59 | Admitting: Family Medicine

## 2016-05-07 DIAGNOSIS — M7061 Trochanteric bursitis, right hip: Secondary | ICD-10-CM

## 2016-05-07 MED ORDER — DICLOFENAC SODIUM 2 % TD SOLN: 2.0000 "application " | Freq: Two times a day (BID) | TRANSDERMAL | 3 refills | Status: DC

## 2016-05-07 NOTE — Patient Instructions (Addendum)
Good to see you  Ice 20 minutes 2 times daily. Usually after activity and before bed. Exercises 3 times a week.  pennsaid pinkie amount topically 2 times daily as needed.  Vitamin D 2000 IU daily Iron 65mg  with 500mg  of vitamin C 3 times a week to help with the leg pain  See me again in 3-4 weeks.

## 2016-05-07 NOTE — Assessment & Plan Note (Signed)
Patient has significant muscular imbalances. Weakness of the hip abductors and tightness of the hip Flexor. Patient given topical anti-inflammatory prescription, work with athletic trainer to learn home exercises, we discussed icing regimen. Discuss if any radiation of the pain starts to occur we need to consider further evaluation of the back. Follow-up again in 4 weeks.

## 2016-05-08 ENCOUNTER — Telehealth: Payer: 59 | Admitting: Physician Assistant

## 2016-05-08 DIAGNOSIS — J019 Acute sinusitis, unspecified: Secondary | ICD-10-CM

## 2016-05-08 DIAGNOSIS — B9689 Other specified bacterial agents as the cause of diseases classified elsewhere: Secondary | ICD-10-CM

## 2016-05-08 MED ORDER — AMOXICILLIN-POT CLAVULANATE 875-125 MG PO TABS: 1.0000 | ORAL_TABLET | Freq: Two times a day (BID) | ORAL | 0 refills | Status: DC

## 2016-05-08 NOTE — Progress Notes (Signed)

## 2016-05-13 ENCOUNTER — Encounter: Payer: Self-pay | Admitting: Internal Medicine

## 2016-05-13 ENCOUNTER — Ambulatory Visit (INDEPENDENT_AMBULATORY_CARE_PROVIDER_SITE_OTHER): Payer: 59 | Admitting: Internal Medicine

## 2016-05-13 DIAGNOSIS — G43009 Migraine without aura, not intractable, without status migrainosus: Secondary | ICD-10-CM

## 2016-05-13 DIAGNOSIS — G43909 Migraine, unspecified, not intractable, without status migrainosus: Secondary | ICD-10-CM | POA: Insufficient documentation

## 2016-05-13 MED ORDER — RIZATRIPTAN BENZOATE 10 MG PO TABS: 10.0000 mg | ORAL_TABLET | ORAL | 0 refills | Status: DC | PRN

## 2016-05-13 NOTE — Progress Notes (Signed)
Pre visit review using our clinic review tool, if applicable. No additional management support is needed unless otherwise documented below in the visit note. 

## 2016-05-13 NOTE — Progress Notes (Signed)
Subjective:    Patient ID: Ashley Barajas, female    DOB: [birth year], 35 y.o.   MRN: [id number]  HPI She is here for an acute visit.   She has a history of migraines. She has been told in the past these hormone related and she has been getting one month. She has taken Imitrex in the past, but it made her very tired so she stopped taking it. She denies any other triggers for migraines.  Last week she has a migraine that started on Saturday.  She took tylenol and advil and it would ease off but it never went away.  Wednesday and Thursday it was severe.  It went away on Friday.  She denies changes in vision or nausea.  The pain in her head started in the top of her head radiated to the back.    She continues to feel significant fatigue. Workup in the past. She states she typically sleeps approximately 7 hours at night and does not feel refreshed when she wakes up. 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 She does not exercise because she energy to exercise.  Medications and allergies reviewed with patient and updated if appropriate.  Patient Active Problem List   Diagnosis Date Noted  . Migraines 05/13/2016  . Greater trochanteric bursitis of right hip 05/07/2016  . Right hip pain 11/12/2015  . Leg pain, bilateral 11/12/2015  . Paronychia of second finger of right hand 08/31/2015  . 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 Depression 04/30/2015  . Fatigue 04/30/2015  . Transient gluten sensitivity 04/30/2015  . Generalized anxiety disorder 04/30/2015    Current Outpatient Prescriptions on File Prior to Visit  Medication Sig Dispense Refill  . amoxicillin-clavulanate (AUGMENTIN) 875-125 MG tablet Take 1 tablet by mouth 2 (two) times daily. 14 tablet 0  . Cholecalciferol (VITAMIN D PO) Take by mouth daily.    . Cyanocobalamin (VITAMIN B-12 PO) Take by mouth daily.    . Diclofenac Sodium (PENNSAID) 2 % SOLN Place 2 application onto the skin 2  (two) times daily. 112 g 3  . EPIPEN 2-PAK 0.3 MG/0.3ML SOAJ injection Reported on 08/29/2015  1  . Fexofenadine HCl (ALLEGRA PO) Take by mouth as needed.    . fluticasone (FLONASE) 50 MCG/ACT nasal spray Place 1-2 sprays into both nostrils 2 (two) times daily. 16 g 6  . Omega-3 Fatty Acids (FISH OIL PO) Take by mouth daily.    . Probiotic Product (PROBIOTIC ADVANCED PO) Take by mouth.     No current facility-administered medications on file prior to visit.     Past Medical History:  Diagnosis Date  . ITP (idiopathic thrombocytopenic purpura)     No past surgical history on file.  Social History   Social History  . Marital status: Married    Spouse name: N/A  . Number of children: 2  . Years of education: N/A   Social History Main Topics  . Smoking status: Former Research scientist (life sciences)  . Smokeless tobacco: Never Used  . Alcohol use No  . Drug use: No  . Sexual activity: Not on file   Other Topics Concern  . 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 Hypertension Mother   . Hypertension Father   . Cancer Son     leukemia  Review of Systems  Constitutional: Positive for fatigue. Negative for fever.  Neurological: Negative for dizziness, weakness, light-headedness and numbness.       Objective:   Vitals:   05/13/16 1136  BP: 112/84  Pulse: (!) 58  Resp: 16  Temp: 97.1 F (36.2 C)   Filed Weights   05/13/16 1136  Weight: 138 lb (62.6 kg)   Body mass index is 24.45 kg/m.   Physical Exam  Constitutional: She appears well-developed and well-nourished. No distress.  Skin: She is not diaphoretic.  Psychiatric: She has a normal mood and affect. Her behavior is normal.        Assessment & Plan:   See Problem List for Assessment and Plan of chronic medical problems.  20 minutes were spent face-to-face with the patient, over 50% of which was spent counseling  regarding her migraines and different treatment options.

## 2016-05-13 NOTE — Patient Instructions (Signed)
  Medications reviewed and updated.  Changes include trying maxalt as needed for migraines.   Your prescription(s) have been submitted to your pharmacy. Please take as directed and contact our office if you believe you are having problem(s) with the medication(s).

## 2016-05-13 NOTE — Assessment & Plan Note (Signed)
Her migraines are hormonal in nature and not new, Well bothersome recently. Her last migraine did not respond to Tylenol and Advil Discussed treatment options-she did not do well with Imitrex in the past, but we discussed other triptan and we will try Maxalt 10 mg as needed for migraines. Discussed that this can cause drowsiness like Imitrex If she does not like this medication she will let me know and we can consider a different one

## 2016-05-21 DIAGNOSIS — M2142 Flat foot [pes planus] (acquired), left foot: Secondary | ICD-10-CM | POA: Diagnosis not present

## 2016-05-21 DIAGNOSIS — Q6689 Other  specified congenital deformities of feet: Secondary | ICD-10-CM | POA: Diagnosis not present

## 2016-05-21 DIAGNOSIS — M2141 Flat foot [pes planus] (acquired), right foot: Secondary | ICD-10-CM | POA: Diagnosis not present

## 2016-05-22 ENCOUNTER — Ambulatory Visit: Payer: 59 | Admitting: Family Medicine

## 2016-09-06 ENCOUNTER — Encounter: Payer: Self-pay | Admitting: Emergency Medicine

## 2016-09-06 ENCOUNTER — Emergency Department
Admission: EM | Admit: 2016-09-06 | Discharge: 2016-09-06 | Disposition: A | Discharge status: Home / Self Care | Payer: 59 | Attending: Emergency Medicine | Admitting: Emergency Medicine

## 2016-09-06 DIAGNOSIS — S0181XA Laceration without foreign body of other part of head, initial encounter: Secondary | ICD-10-CM | POA: Insufficient documentation

## 2016-09-06 DIAGNOSIS — Z87891 Personal history of nicotine dependence: Secondary | ICD-10-CM | POA: Insufficient documentation

## 2016-09-06 DIAGNOSIS — Y9289 Other specified places as the place of occurrence of the external cause: Secondary | ICD-10-CM | POA: Insufficient documentation

## 2016-09-06 DIAGNOSIS — Y999 Unspecified external cause status: Secondary | ICD-10-CM | POA: Diagnosis not present

## 2016-09-06 DIAGNOSIS — Y939 Activity, unspecified: Secondary | ICD-10-CM | POA: Diagnosis not present

## 2016-09-06 DIAGNOSIS — S0993XA Unspecified injury of face, initial encounter: Secondary | ICD-10-CM | POA: Diagnosis present

## 2016-09-06 LAB — GLUCOSE, CAPILLARY: GLUCOSE-CAPILLARY: 100 mg/dL — AB (ref 65–99)

## 2016-09-06 MED ORDER — LIDOCAINE HCL (PF) 1 % IJ SOLN
INTRAMUSCULAR | Status: AC
  Filled 2016-09-06: qty 5

## 2016-09-06 MED ORDER — BACITRACIN ZINC 500 UNIT/GM EX OINT
1.0000 "application " | TOPICAL_OINTMENT | Freq: Two times a day (BID) | CUTANEOUS | Status: DC
  Administered 2016-09-06: 1 via TOPICAL
  Filled 2016-09-06: qty 0.9

## 2016-09-06 MED ORDER — LIDOCAINE-EPINEPHRINE-TETRACAINE (LET) SOLUTION
NASAL | Status: AC
  Filled 2016-09-06: qty 3

## 2016-09-06 MED ORDER — LIDOCAINE-EPINEPHRINE-TETRACAINE (LET) SOLUTION
3.0000 mL | Freq: Once | NASAL | Status: DC
  Filled 2016-09-06: qty 3
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
## 2016-09-06 NOTE — Discharge Instructions (Signed)
Do not get the sutured area wet for 24 hours. After 24 hours, shower/bathe as usual and pat the area dry. Change the bandage 2 times per day and apply antibiotic ointment. Leave open to air when at no risk of getting the area dirty, but cover at night before bed. Sutures should be removed in 4-5 days. Follow up with urgent care or your primary care provider for suture removal or for any concerning or sign of infection.
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
## 2016-09-07 ENCOUNTER — Telehealth: Payer: 59 | Admitting: Family

## 2016-09-07 DIAGNOSIS — B029 Zoster without complications: Secondary | ICD-10-CM

## 2016-09-07 MED ORDER — VALACYCLOVIR HCL 1 G PO TABS: 1000.0000 mg | ORAL_TABLET | Freq: Three times a day (TID) | ORAL | 0 refills | Status: AC

## 2016-09-07 NOTE — Progress Notes (Signed)
E Visit for Rash  We are sorry that you are not feeling well. Here is how we plan to help!  Based on the image you uploaded as well as our telephone conversation, it does appear that you have shingles. Per our discussion, you can use the compresses at night that we talked about and alternating with Calamine lotion in the daytime covered by dry 4x4 cause with tegaderm or medical tape.  I have prescribed Valacyclovir 1000mg  every 8 hours for 7 days   HOME CARE:   Take cool showers and avoid direct sunlight.  Apply cool compress or wet dressings.  Take a bath in an oatmeal bath.  Sprinkle content of one Aveeno packet under running faucet with comfortably warm water.  Bathe for 15-20 minutes, 1-2 times daily.  Pat dry with a towel. Do not rub the rash.  Use hydrocortisone cream.  Take an antihistamine like Benadryl for widespread rashes that itch.  The adult dose of Benadryl is 25-50 mg by mouth 4 times daily.  Caution:  This type of medication may cause sleepiness.  Do not drink alcohol, drive, or operate dangerous machinery while taking antihistamines.  Do not take these medications if you have prostate enlargement.  Read package instructions thoroughly on all medications that you take.  GET HELP RIGHT AWAY IF:   Symptoms don't go away after treatment.  Severe itching that persists.  If you rash spreads or swells.  If you rash begins to smell.  If it blisters and opens or develops a yellow-brown crust.  You develop a fever.  You have a sore throat.  You become short of breath.  MAKE SURE YOU:  Understand these instructions. Will watch your condition. Will get help right away if you are not doing well or get worse.  Thank you for choosing an e-visit. Your e-visit answers were reviewed by a board certified advanced clinical practitioner to complete your personal care plan. Depending upon the condition, your plan could have included both over the counter or prescription  medications. Please review your pharmacy choice. Be sure that the pharmacy you have chosen is open so that you can pick up your prescription now.  If there is a problem you may message your provider in [place] to have the prescription routed to another pharmacy. Your safety is important to [place]. If you have drug allergies check your prescription carefully.  For the next 24 hours, you can use MyChart to ask questions about today's visit, request a non-urgent call back, or ask for a work or school excuse from your e-visit provider. You will get an email in the next two days asking about your experience. I hope that your e-visit has been valuable and will speed your recovery.

## 2016-09-11 ENCOUNTER — Ambulatory Visit (INDEPENDENT_AMBULATORY_CARE_PROVIDER_SITE_OTHER): Payer: 59 | Admitting: Family

## 2016-09-11 ENCOUNTER — Encounter: Payer: Self-pay | Admitting: Family

## 2016-09-11 VITALS — BP 138/86 | HR 61 | Resp 16 | Ht 63.0 in | Wt 140.0 lb

## 2016-09-11 DIAGNOSIS — S0181XA Laceration without foreign body of other part of head, initial encounter: Secondary | ICD-10-CM | POA: Insufficient documentation

## 2016-09-11 DIAGNOSIS — S0181XD Laceration without foreign body of other part of head, subsequent encounter: Secondary | ICD-10-CM | POA: Diagnosis not present
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
## 2016-09-11 NOTE — Patient Instructions (Signed)
Thank you for choosing [facility].  SUMMARY AND INSTRUCTIONS:  Keep the site cleansed with soap and water. No peroxide or alcohol.  Avoid aggressive scrubbing for the next couple of weeks.   Monitor for signs of infection.   Medication:  Your prescription(s) have been submitted to your pharmacy or been printed and provided for you. Please take as directed and contact our office if you believe you are having problem(s) with the medication(s) or have any questions.  Follow up:  If your symptoms worsen or fail to improve, please contact our office for further instruction, or in case of emergency go directly to the emergency room at the closest medical facility.

## 2016-09-11 NOTE — Assessment & Plan Note (Signed)
Laceration of chin appears well healed with good approximation and no evidence of infection. 5 sutures removed without complication. Continue basic wound care and follow-up as needed or if signs of infection develop.

## 2016-09-15 DIAGNOSIS — D1801 Hemangioma of skin and subcutaneous tissue: Secondary | ICD-10-CM | POA: Diagnosis not present

## 2016-09-15 DIAGNOSIS — L814 Other melanin hyperpigmentation: Secondary | ICD-10-CM | POA: Diagnosis not present

## 2016-09-15 DIAGNOSIS — D489 Neoplasm of uncertain behavior, unspecified: Secondary | ICD-10-CM | POA: Diagnosis not present

## 2016-09-15 DIAGNOSIS — C4359 Malignant melanoma of other part of trunk: Secondary | ICD-10-CM | POA: Diagnosis not present

## 2016-09-15 DIAGNOSIS — D225 Melanocytic nevi of trunk: Secondary | ICD-10-CM | POA: Diagnosis not present

## 2016-09-29 DIAGNOSIS — Z01419 Encounter for gynecological examination (general) (routine) without abnormal findings: Secondary | ICD-10-CM | POA: Diagnosis not present

## 2016-09-29 DIAGNOSIS — Z6824 Body mass index (BMI) 24.0-24.9, adult: Secondary | ICD-10-CM | POA: Diagnosis not present

## 2016-10-12 DIAGNOSIS — C4359 Malignant melanoma of other part of trunk: Secondary | ICD-10-CM

## 2016-10-12 HISTORY — DX: Malignant melanoma of other part of trunk: C43.59

## 2016-10-20 DIAGNOSIS — C4359 Malignant melanoma of other part of trunk: Secondary | ICD-10-CM | POA: Diagnosis not present

## 2016-10-23 ENCOUNTER — Encounter (HOSPITAL_BASED_OUTPATIENT_CLINIC_OR_DEPARTMENT_OTHER): Payer: Self-pay | Admitting: *Deleted

## 2016-10-23 NOTE — Pre-Procedure Instructions (Signed)
Hx. of ITP discussed with Dr. Conrad [place]; pt. needs CBC prior to surgery.  Pt. notified to come for CBC.

## 2016-10-24 ENCOUNTER — Other Ambulatory Visit: Payer: Self-pay | Admitting: General Surgery

## 2016-10-26 ENCOUNTER — Encounter: Payer: Self-pay | Admitting: Internal Medicine

## 2016-10-26 DIAGNOSIS — C439 Malignant melanoma of skin, unspecified: Secondary | ICD-10-CM | POA: Insufficient documentation

## 2016-10-26 DIAGNOSIS — Z85820 Personal history of malignant melanoma of skin: Secondary | ICD-10-CM | POA: Insufficient documentation

## 2016-10-27 ENCOUNTER — Encounter (HOSPITAL_BASED_OUTPATIENT_CLINIC_OR_DEPARTMENT_OTHER)
Admission: RE | Admit: 2016-10-27 | Discharge: 2016-10-27 | Disposition: A | Discharge status: Home / Self Care | Payer: 59 | Source: Ambulatory Visit | Attending: General Surgery | Admitting: General Surgery

## 2016-10-27 DIAGNOSIS — Z87891 Personal history of nicotine dependence: Secondary | ICD-10-CM | POA: Diagnosis not present

## 2016-10-27 DIAGNOSIS — Z975 Presence of (intrauterine) contraceptive device: Secondary | ICD-10-CM | POA: Diagnosis not present

## 2016-10-27 DIAGNOSIS — Z811 Family history of alcohol abuse and dependence: Secondary | ICD-10-CM | POA: Diagnosis not present

## 2016-10-27 DIAGNOSIS — Z881 Allergy status to other antibiotic agents status: Secondary | ICD-10-CM | POA: Diagnosis not present

## 2016-10-27 DIAGNOSIS — Z8249 Family history of ischemic heart disease and other diseases of the circulatory system: Secondary | ICD-10-CM | POA: Diagnosis not present

## 2016-10-27 DIAGNOSIS — Z803 Family history of malignant neoplasm of breast: Secondary | ICD-10-CM | POA: Diagnosis not present

## 2016-10-27 DIAGNOSIS — Z7951 Long term (current) use of inhaled steroids: Secondary | ICD-10-CM | POA: Diagnosis not present

## 2016-10-27 DIAGNOSIS — Z841 Family history of disorders of kidney and ureter: Secondary | ICD-10-CM | POA: Diagnosis not present

## 2016-10-27 DIAGNOSIS — Z79899 Other long term (current) drug therapy: Secondary | ICD-10-CM | POA: Diagnosis not present

## 2016-10-27 DIAGNOSIS — Z8261 Family history of arthritis: Secondary | ICD-10-CM | POA: Diagnosis not present

## 2016-10-27 DIAGNOSIS — Z818 Family history of other mental and behavioral disorders: Secondary | ICD-10-CM | POA: Diagnosis not present

## 2016-10-27 DIAGNOSIS — Z832 Family history of diseases of the blood and blood-forming organs and certain disorders involving the immune mechanism: Secondary | ICD-10-CM | POA: Diagnosis not present

## 2016-10-27 DIAGNOSIS — C4359 Malignant melanoma of other part of trunk: Secondary | ICD-10-CM | POA: Diagnosis not present

## 2016-10-27 DIAGNOSIS — Z836 Family history of other diseases of the respiratory system: Secondary | ICD-10-CM | POA: Diagnosis not present

## 2016-10-27 DIAGNOSIS — G43909 Migraine, unspecified, not intractable, without status migrainosus: Secondary | ICD-10-CM | POA: Diagnosis not present

## 2016-10-27 LAB — CBC
HEMATOCRIT: 37.2 % (ref 36.0–46.0)
HEMOGLOBIN: 12.3 g/dL (ref 12.0–15.0)
MCH: 29.2 pg (ref 26.0–34.0)
MCHC: 33.1 g/dL (ref 30.0–36.0)
MCV: 88.4 fL (ref 78.0–100.0)
Platelets: 159 10*3/uL (ref 150–400)
RBC: 4.21 MIL/uL (ref 3.87–5.11)
RDW: 13.4 % (ref 11.5–15.5)
WBC: 6.8 10*3/uL (ref 4.0–10.5)

## 2016-10-28 NOTE — H&P (Signed)
Ashley Barajas 10/20/2016 2:46 PM Location: [facility] Patient #: [id number] DOB: 11-16-1980 Married / Language: English / Race: White Female   History of Present Illness Stark Klein MD; 10/20/2016 3:39 PM) The patient is a 36 year old female who presents with malignant melanoma. Patient is a lovely 36 year old female referred by Dr. Renda Rolls of dermatology for consultation regarding a new diagnosis of melanoma. Her husband noted a irregular greenish colored lesion on her right buttock one to 2 years ago. He did not feel that an enlarged or became dark, but continued to bother her about getting it evaluated. She denies scabbing, itching, or bleeding. A punch biopsy demonstrated a 0.7 mm melanoma. No ulceration was noted. There were also no mitotic figures seen and no lymphovascular invasion or perineural invasion. Extensive regression was noted. Peripheral margins were positive, but the deep margins were negative.   She has no family history of melanoma, but does have family history of breast cancer. She had 1-2 blistering sunburns as a child. She does tan, but will get mild sunburns with sun exposure.     Past Surgical History Malachy Moan, [place]; 10/20/2016 2:46 PM) No pertinent past surgical history   Diagnostic Studies History Malachy Moan, [place]; 10/20/2016 2:46 PM) Colonoscopy  never Mammogram  never Pap Smear  1-5 years ago  Allergies Malachy Moan, RMA; 10/20/2016 2:48 PM) Clarithromycin *CHEMICALS*   Medication History Malachy Moan, RMA; 10/20/2016 2:50 PM) Vitamin D (Cholecalciferol) (Oral) Specific strength unknown - Active. Vitamin B-12 (Oral) Specific strength unknown - Active. Pennsaid (2% Solution, Transdermal) Active. EpiPen 2-Pak (0.3MG /0.3ML Soln Auto-inj, Injection) Active. Allegra (Oral) Specific strength unknown - Active. Flonase (50MCG/ACT Suspension, Nasal) Active. Fish Oil (1000MG  Capsule, Oral) Active. Maxalt (10MG   Tablet, Oral) Active. Medications Reconciled  Social History Malachy Moan, [place]; 10/20/2016 2:46 PM) No alcohol use  No caffeine use  No drug use  Tobacco use  Former smoker.  Family History Malachy Moan, [place]; 10/20/2016 2:46 PM) Alcohol Abuse  Father. Arthritis  Mother. Bleeding disorder  Family Members In General. Breast Cancer  Family Members In General. Depression  Brother, Sister. Hypertension  Mother. Kidney Disease  Sister. Respiratory Condition  Father.  Pregnancy / Birth History Malachy Moan, [place]; 10/20/2016 2:47 PM) Age at menarche  65 years. Contraceptive History  Intrauterine device, Oral contraceptives. Gravida  2 Irregular periods  Length (months) of breastfeeding  3-6 Maternal age  31-20 Para  2  Other Problems Malachy Moan, [place]; 10/20/2016 2:46 PM) Anxiety Disorder  Depression  Melanoma  Migraine Headache     Review of Systems Malachy Moan RMA; 10/20/2016 2:47 PM) General Present- Fatigue and Weight Gain. Not Present- Appetite Loss, Chills, Fever, Night Sweats and Weight Loss. Skin Not Present- Change in Wart/Mole, Dryness, Hives, Jaundice, New Lesions, Non-Healing Wounds, Rash and Ulcer. HEENT Present- Nose Bleed, Seasonal Allergies, Sinus Pain and Wears glasses/contact lenses. Not Present- Earache, Hearing Loss, Hoarseness, Oral Ulcers, Ringing in the Ears, Sore Throat, Visual Disturbances and Yellow Eyes. Respiratory Not Present- Bloody sputum, Chronic Cough, Difficulty Breathing, Snoring and Wheezing. Breast Not Present- Breast Mass, Breast Pain, Nipple Discharge and Skin Changes. Cardiovascular Not Present- Chest Pain, Difficulty Breathing Lying Down, Leg Cramps, Palpitations, Rapid Heart Rate, Shortness of Breath and Swelling of Extremities. Gastrointestinal Present- Constipation. Not Present- Abdominal Pain, Bloating, Bloody Stool, Change in Bowel Habits, Chronic diarrhea, Difficulty Swallowing, Excessive gas,  Gets full quickly at meals, Hemorrhoids, Indigestion, Nausea, Rectal Pain and Vomiting. Female Genitourinary Not Present- Frequency, Nocturia, Painful Urination, Pelvic Pain  and Urgency. Musculoskeletal Present- Joint Pain. Not Present- Back Pain, Joint Stiffness, Muscle Pain, Muscle Weakness and Swelling of Extremities. Neurological Not Present- Decreased Memory, Fainting, Headaches, Numbness, Seizures, Tingling, Tremor, Trouble walking and Weakness. Psychiatric Not Present- Anxiety, Bipolar, Change in Sleep Pattern, Depression, Fearful and Frequent crying. Endocrine Not Present- Cold Intolerance, Excessive Hunger, Hair Changes, Heat Intolerance, Hot flashes and New Diabetes. Hematology Not Present- Blood Thinners, Easy Bruising, Excessive bleeding, Gland problems, HIV and Persistent Infections.  Vitals Malachy Moan RMA; 10/20/2016 2:50 PM) 10/20/2016 2:50 PM Weight: 135.8 lb Height: 63in Body Surface Area: 1.64 m Body Mass Index: 24.06 kg/m  Temp.: 98.63F  Pulse: 63 (Regular)  BP: 118/80 (Sitting, Left Arm, Standard)       Physical Exam Stark Klein MD; 10/20/2016 3:41 PM) General Mental Status-Alert. General Appearance-Consistent with stated age. Hydration-Well hydrated. Voice-Normal.  Integumentary Note: healing biopsy scar right mid buttock.   Head and Neck Head-normocephalic, atraumatic with no lesions or palpable masses. Trachea-midline. Thyroid Gland Characteristics - normal size and consistency.  Eye Eyeball - Bilateral-Extraocular movements intact. Sclera/Conjunctiva - Bilateral-No scleral icterus.  Chest and Lung Exam Chest and lung exam reveals -quiet, even and easy respiratory effort with no use of accessory muscles and on auscultation, normal breath sounds, no adventitious sounds and normal vocal resonance. Inspection Chest Wall - Normal. Back - normal.  Cardiovascular Cardiovascular examination reveals -normal heart  sounds, regular rate and rhythm with no murmurs and normal pedal pulses bilaterally.  Abdomen Inspection Inspection of the abdomen reveals - No Hernias. Palpation/Percussion Palpation and Percussion of the abdomen reveal - Soft, Non Tender, No Rebound tenderness, No Rigidity (guarding) and No hepatosplenomegaly. Auscultation Auscultation of the abdomen reveals - Bowel sounds normal.  Neurologic Neurologic evaluation reveals -alert and oriented x 3 with no impairment of recent or remote memory. Mental Status-Normal.  Musculoskeletal Global Assessment -Note: no gross deformities.  Normal Exam - Left-Upper Extremity Strength Normal and Lower Extremity Strength Normal. Normal Exam - Right-Upper Extremity Strength Normal and Lower Extremity Strength Normal.  Lymphatic Head & Neck  General Head & Neck Lymphatics: Bilateral - Description - Normal. Axillary  General Axillary Region: Bilateral - Description - Normal. Tenderness - Non Tender. Femoral & Inguinal  Generalized Femoral & Inguinal Lymphatics: Bilateral - Description - No Generalized lymphadenopathy.    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  She will need a wide local excision of the melanoma. Given that deep margin was negative, no mitoses were seen, no LVI/ulceration/perineural invasion was seen, and breslow depth was 0.7 mm, I will NOT plan a sentinel lymph node biopsy.  I discussed the surgery with the patient and husband. I reviewed that I would take 1 cm margins around the lesion/scar and then would make that circular incision into an ellipse. This would leave around a 1-2 inch scar.  I discussed that the patient could have general anesthesia or sedation with numbing medicine. The patient has not ever had general anesthesia and is a bit anxious. She is also a Marine scientist. The  patient's husband noted that she had a chin incision but got setup with local anesthesia and had some hypertension associated with that. I discussed that she would be in the lateral or prone position and that sometimes anesthesia has preference is about the airway in that regard.  We will schedule this at the first available opportunity. I  reviewed that she would not be able to do any frequent bending or heavy lifting for a week.  I discussed the risk of bleeding, infection, wound dehiscence, anesthetic issues, numbness, or others. I discussed the risk of positive margins. Current Plans You are being scheduled for surgery- Our schedulers will call you.  You should hear from our office's scheduling department within 5 working days about the location, date, and time of surgery. We try to make accommodations for patient's preferences in scheduling surgery, but sometimes the OR schedule or the surgeon's schedule prevents [place] from making those accommodations.  If you have not heard from our office [phone]) in 5 working days, call the office and ask for your surgeon's nurse.  If you have other questions about your diagnosis, plan, or surgery, call the office and ask for your surgeon's nurse.  Pt Education - Melanoma: cancer   Signed by Stark Klein, MD (10/20/2016 3:47 PM)

## 2016-10-30 ENCOUNTER — Ambulatory Visit (HOSPITAL_BASED_OUTPATIENT_CLINIC_OR_DEPARTMENT_OTHER): Payer: 59 | Admitting: Certified Registered"

## 2016-10-30 ENCOUNTER — Encounter (HOSPITAL_BASED_OUTPATIENT_CLINIC_OR_DEPARTMENT_OTHER): Payer: Self-pay

## 2016-10-30 ENCOUNTER — Ambulatory Visit (HOSPITAL_BASED_OUTPATIENT_CLINIC_OR_DEPARTMENT_OTHER)
Admission: RE | Admit: 2016-10-30 | Discharge: 2016-10-30 | Disposition: A | Discharge status: Home / Self Care | Payer: 59 | Source: Ambulatory Visit | Attending: General Surgery | Admitting: General Surgery

## 2016-10-30 ENCOUNTER — Encounter (HOSPITAL_BASED_OUTPATIENT_CLINIC_OR_DEPARTMENT_OTHER)
Admission: RE | Disposition: A | Discharge status: Home / Self Care | Payer: Self-pay | Source: Ambulatory Visit | Attending: General Surgery

## 2016-10-30 DIAGNOSIS — Z811 Family history of alcohol abuse and dependence: Secondary | ICD-10-CM | POA: Insufficient documentation

## 2016-10-30 DIAGNOSIS — C4359 Malignant melanoma of other part of trunk: Secondary | ICD-10-CM | POA: Insufficient documentation

## 2016-10-30 DIAGNOSIS — Z841 Family history of disorders of kidney and ureter: Secondary | ICD-10-CM | POA: Insufficient documentation

## 2016-10-30 DIAGNOSIS — Z818 Family history of other mental and behavioral disorders: Secondary | ICD-10-CM | POA: Insufficient documentation

## 2016-10-30 DIAGNOSIS — Z8249 Family history of ischemic heart disease and other diseases of the circulatory system: Secondary | ICD-10-CM | POA: Insufficient documentation

## 2016-10-30 DIAGNOSIS — Z832 Family history of diseases of the blood and blood-forming organs and certain disorders involving the immune mechanism: Secondary | ICD-10-CM | POA: Insufficient documentation

## 2016-10-30 DIAGNOSIS — Z803 Family history of malignant neoplasm of breast: Secondary | ICD-10-CM | POA: Insufficient documentation

## 2016-10-30 DIAGNOSIS — Z87891 Personal history of nicotine dependence: Secondary | ICD-10-CM | POA: Insufficient documentation

## 2016-10-30 DIAGNOSIS — Z79899 Other long term (current) drug therapy: Secondary | ICD-10-CM | POA: Diagnosis not present

## 2016-10-30 DIAGNOSIS — D0359 Melanoma in situ of other part of trunk: Secondary | ICD-10-CM | POA: Diagnosis not present

## 2016-10-30 DIAGNOSIS — F418 Other specified anxiety disorders: Secondary | ICD-10-CM | POA: Diagnosis not present

## 2016-10-30 DIAGNOSIS — Z881 Allergy status to other antibiotic agents status: Secondary | ICD-10-CM | POA: Diagnosis not present

## 2016-10-30 DIAGNOSIS — Z7951 Long term (current) use of inhaled steroids: Secondary | ICD-10-CM | POA: Diagnosis not present

## 2016-10-30 DIAGNOSIS — Z836 Family history of other diseases of the respiratory system: Secondary | ICD-10-CM | POA: Insufficient documentation

## 2016-10-30 DIAGNOSIS — G43909 Migraine, unspecified, not intractable, without status migrainosus: Secondary | ICD-10-CM | POA: Insufficient documentation

## 2016-10-30 DIAGNOSIS — Z975 Presence of (intrauterine) contraceptive device: Secondary | ICD-10-CM | POA: Diagnosis not present

## 2016-10-30 DIAGNOSIS — Z8261 Family history of arthritis: Secondary | ICD-10-CM | POA: Insufficient documentation

## 2016-10-30 HISTORY — DX: Constipation, unspecified: K59.00

## 2016-10-30 HISTORY — DX: Amenorrhea, unspecified: N91.2

## 2016-10-30 HISTORY — DX: Migraine, unspecified, not intractable, without status migrainosus: G43.909

## 2016-10-30 HISTORY — DX: Malignant melanoma of other part of trunk: C43.59

## 2016-10-30 HISTORY — PX: MELANOMA EXCISION: SHX5266

## 2016-10-30 HISTORY — DX: Anemia, unspecified: D64.9

## 2016-10-30 SURGERY — EXCISION, MELANOMA
Anesthesia: General | Site: Buttocks | Laterality: Right

## 2016-10-30 MED ORDER — BUPIVACAINE HCL (PF) 0.5 % IJ SOLN
INTRAMUSCULAR | Status: AC
  Filled 2016-10-30: qty 30

## 2016-10-30 MED ORDER — PROPOFOL 10 MG/ML IV BOLUS
INTRAVENOUS | Status: DC | PRN
  Administered 2016-10-30: 200 mg via INTRAVENOUS

## 2016-10-30 MED ORDER — MIDAZOLAM HCL 2 MG/2ML IJ SOLN
1.0000 mg | INTRAMUSCULAR | Status: DC | PRN
  Administered 2016-10-30: 2 mg via INTRAVENOUS

## 2016-10-30 MED ORDER — LACTATED RINGERS IV SOLN
INTRAVENOUS | Status: DC
  Administered 2016-10-30 (×2): via INTRAVENOUS

## 2016-10-30 MED ORDER — BUPIVACAINE-EPINEPHRINE (PF) 0.5% -1:200000 IJ SOLN
INTRAMUSCULAR | Status: AC
  Filled 2016-10-30: qty 1.8

## 2016-10-30 MED ORDER — FENTANYL CITRATE (PF) 100 MCG/2ML IJ SOLN
50.0000 ug | INTRAMUSCULAR | Status: DC | PRN
  Administered 2016-10-30: 100 ug via INTRAVENOUS

## 2016-10-30 MED ORDER — LIDOCAINE-EPINEPHRINE (PF) 1 %-1:200000 IJ SOLN
INTRAMUSCULAR | Status: DC | PRN
  Administered 2016-10-30: 20 mL

## 2016-10-30 MED ORDER — MIDAZOLAM HCL 2 MG/2ML IJ SOLN
INTRAMUSCULAR | Status: AC
  Filled 2016-10-30: qty 2

## 2016-10-30 MED ORDER — OXYCODONE HCL 5 MG PO TABS: 5.0000 mg | ORAL_TABLET | Freq: Once | ORAL | Status: DC | PRN

## 2016-10-30 MED ORDER — FENTANYL CITRATE (PF) 100 MCG/2ML IJ SOLN: 25.0000 ug | INTRAMUSCULAR | Status: DC | PRN

## 2016-10-30 MED ORDER — ONDANSETRON HCL 4 MG/2ML IJ SOLN: 4.0000 mg | Freq: Once | INTRAMUSCULAR | Status: DC | PRN

## 2016-10-30 MED ORDER — SCOPOLAMINE 1 MG/3DAYS TD PT72
1.0000 | MEDICATED_PATCH | Freq: Once | TRANSDERMAL | Status: DC | PRN
Start: 2016-10-30 — End: 2016-10-30

## 2016-10-30 MED ORDER — SUGAMMADEX SODIUM 200 MG/2ML IV SOLN
INTRAVENOUS | Status: DC | PRN
  Administered 2016-10-30: 200 mg via INTRAVENOUS

## 2016-10-30 MED ORDER — CEFAZOLIN SODIUM-DEXTROSE 2-4 GM/100ML-% IV SOLN
INTRAVENOUS | Status: AC
  Filled 2016-10-30: qty 100

## 2016-10-30 MED ORDER — CHLORHEXIDINE GLUCONATE CLOTH 2 % EX PADS: 6.0000 | MEDICATED_PAD | Freq: Once | CUTANEOUS | Status: DC

## 2016-10-30 MED ORDER — PHENYLEPHRINE 40 MCG/ML (10ML) SYRINGE FOR IV PUSH (FOR BLOOD PRESSURE SUPPORT)
PREFILLED_SYRINGE | INTRAVENOUS | Status: AC
  Filled 2016-10-30: qty 10

## 2016-10-30 MED ORDER — ONDANSETRON HCL 4 MG/2ML IJ SOLN
INTRAMUSCULAR | Status: AC
  Filled 2016-10-30: qty 2

## 2016-10-30 MED ORDER — OXYCODONE HCL 5 MG PO TABS: 5.0000 mg | ORAL_TABLET | Freq: Four times a day (QID) | ORAL | 0 refills | Status: DC | PRN

## 2016-10-30 MED ORDER — CEFAZOLIN SODIUM-DEXTROSE 2-4 GM/100ML-% IV SOLN
2.0000 g | INTRAVENOUS | Status: AC
  Administered 2016-10-30: 2 g via INTRAVENOUS

## 2016-10-30 MED ORDER — ROCURONIUM BROMIDE 100 MG/10ML IV SOLN
INTRAVENOUS | Status: DC | PRN
  Administered 2016-10-30: 40 mg via INTRAVENOUS

## 2016-10-30 MED ORDER — OXYCODONE HCL 5 MG/5ML PO SOLN: 5.0000 mg | Freq: Once | ORAL | Status: DC | PRN

## 2016-10-30 MED ORDER — FENTANYL CITRATE (PF) 100 MCG/2ML IJ SOLN
INTRAMUSCULAR | Status: AC
  Filled 2016-10-30: qty 2

## 2016-10-30 MED ORDER — DEXAMETHASONE SODIUM PHOSPHATE 4 MG/ML IJ SOLN
INTRAMUSCULAR | Status: DC | PRN
  Administered 2016-10-30: 10 mg via INTRAVENOUS

## 2016-10-30 MED ORDER — LIDOCAINE HCL (CARDIAC) 20 MG/ML IV SOLN
INTRAVENOUS | Status: DC | PRN
  Administered 2016-10-30: 70 mg via INTRAVENOUS

## 2016-10-30 MED ORDER — LIDOCAINE-EPINEPHRINE (PF) 1 %-1:200000 IJ SOLN
INTRAMUSCULAR | Status: AC
  Filled 2016-10-30: qty 30

## 2016-10-30 MED ORDER — DEXAMETHASONE SODIUM PHOSPHATE 10 MG/ML IJ SOLN
INTRAMUSCULAR | Status: AC
  Filled 2016-10-30: qty 1

## 2016-10-30 MED ORDER — SUCCINYLCHOLINE CHLORIDE 200 MG/10ML IV SOSY
PREFILLED_SYRINGE | INTRAVENOUS | Status: AC
  Filled 2016-10-30: qty 10

## 2016-10-30 MED ORDER — LIDOCAINE 2% (20 MG/ML) 5 ML SYRINGE
INTRAMUSCULAR | Status: AC
  Filled 2016-10-30: qty 5

## 2016-10-30 MED ORDER — SUGAMMADEX SODIUM 200 MG/2ML IV SOLN
INTRAVENOUS | Status: AC
  Filled 2016-10-30: qty 2

## 2016-10-30 MED ORDER — EPHEDRINE 5 MG/ML INJ
INTRAVENOUS | Status: AC
  Filled 2016-10-30: qty 10

## 2016-10-30 SURGICAL SUPPLY — 53 items
BENZOIN TINCTURE PRP APPL 2/3 (GAUZE/BANDAGES/DRESSINGS) ×2 IMPLANT
BLADE HEX COATED 2.75 (ELECTRODE) ×2 IMPLANT
BLADE SURG 10 STRL SS (BLADE) ×2 IMPLANT
BLADE SURG 15 STRL LF DISP TIS (BLADE) ×1 IMPLANT
BLADE SURG 15 STRL SS (BLADE) ×1
CANISTER SUCT 1200ML W/VALVE (MISCELLANEOUS) ×2 IMPLANT
CHLORAPREP W/TINT 26ML (MISCELLANEOUS) ×2 IMPLANT
COVER BACK TABLE 60X90IN (DRAPES) ×2 IMPLANT
COVER MAYO STAND STRL (DRAPES) ×2 IMPLANT
DECANTER SPIKE VIAL GLASS SM (MISCELLANEOUS) IMPLANT
DERMABOND ADVANCED (GAUZE/BANDAGES/DRESSINGS)
DERMABOND ADVANCED .7 DNX12 (GAUZE/BANDAGES/DRESSINGS) IMPLANT
DRAPE LAPAROTOMY 100X72 PEDS (DRAPES) ×2 IMPLANT
DRAPE UTILITY XL STRL (DRAPES) ×2 IMPLANT
DRSG TEGADERM 2-3/8X2-3/4 SM (GAUZE/BANDAGES/DRESSINGS) ×2 IMPLANT
DRSG TEGADERM 4X4.75 (GAUZE/BANDAGES/DRESSINGS) ×2 IMPLANT
ELECT REM PT RETURN 9FT ADLT (ELECTROSURGICAL) ×2
ELECTRODE REM PT RTRN 9FT ADLT (ELECTROSURGICAL) ×1 IMPLANT
GAUZE SPONGE 4X4 12PLY STRL LF (GAUZE/BANDAGES/DRESSINGS) ×2 IMPLANT
GLOVE BIO SURGEON STRL SZ 6 (GLOVE) ×2 IMPLANT
GLOVE BIO SURGEON STRL SZ 6.5 (GLOVE) ×2 IMPLANT
GLOVE BIOGEL PI IND STRL 6.5 (GLOVE) ×1 IMPLANT
GLOVE BIOGEL PI IND STRL 7.0 (GLOVE) ×1 IMPLANT
GLOVE BIOGEL PI INDICATOR 6.5 (GLOVE) ×1
GLOVE BIOGEL PI INDICATOR 7.0 (GLOVE) ×1
GOWN STRL REUS W/ TWL LRG LVL3 (GOWN DISPOSABLE) ×1 IMPLANT
GOWN STRL REUS W/TWL 2XL LVL3 (GOWN DISPOSABLE) ×2 IMPLANT
GOWN STRL REUS W/TWL LRG LVL3 (GOWN DISPOSABLE) ×1
NEEDLE HYPO 25X1 1.5 SAFETY (NEEDLE) ×2 IMPLANT
NS IRRIG 1000ML POUR BTL (IV SOLUTION) IMPLANT
PACK BASIN DAY SURGERY FS (CUSTOM PROCEDURE TRAY) ×2 IMPLANT
PACK UNIVERSAL I (CUSTOM PROCEDURE TRAY) IMPLANT
PENCIL BUTTON HOLSTER BLD 10FT (ELECTRODE) ×2 IMPLANT
SLEEVE SCD COMPRESS KNEE MED (MISCELLANEOUS) ×2 IMPLANT
SPONGE LAP 18X18 X RAY DECT (DISPOSABLE) ×2 IMPLANT
STAPLER VISISTAT 35W (STAPLE) IMPLANT
STRIP CLOSURE SKIN 1/2X4 (GAUZE/BANDAGES/DRESSINGS) ×2 IMPLANT
SUT ETHILON 2 0 FS 18 (SUTURE) ×2 IMPLANT
SUT MNCRL AB 4-0 PS2 18 (SUTURE) IMPLANT
SUT MON AB 4-0 PC3 18 (SUTURE) ×2 IMPLANT
SUT SILK 2 0 SH (SUTURE) ×2 IMPLANT
SUT SILK 3 0 TIES 17X18 (SUTURE)
SUT SILK 3-0 18XBRD TIE BLK (SUTURE) IMPLANT
SUT VIC AB 2-0 SH 27 (SUTURE) ×1
SUT VIC AB 2-0 SH 27XBRD (SUTURE) ×1 IMPLANT
SUT VIC AB 3-0 SH 27 (SUTURE) ×1
SUT VIC AB 3-0 SH 27X BRD (SUTURE) ×1 IMPLANT
SUT VICRYL 4-0 PS2 18IN ABS (SUTURE) IMPLANT
SYR CONTROL 10ML LL (SYRINGE) ×2 IMPLANT
TOWEL OR 17X24 6PK STRL BLUE (TOWEL DISPOSABLE) ×2 IMPLANT
TOWEL OR NON WOVEN STRL DISP B (DISPOSABLE) ×2 IMPLANT
TUBE CONNECTING 20X1/4 (TUBING) ×2 IMPLANT
YANKAUER SUCT BULB TIP NO VENT (SUCTIONS) ×2 IMPLANT

## 2016-10-30 NOTE — Anesthesia Procedure Notes (Signed)
Procedure Name: Intubation Date/Time: 10/30/2016 9:06 AM Performed by: Melynda Ripple D Pre-anesthesia Checklist: Patient identified, Emergency Drugs available, Suction available and Patient being monitored Patient Re-evaluated:Patient Re-evaluated prior to inductionOxygen Delivery Method: Circle system utilized Preoxygenation: Pre-oxygenation with 100% oxygen Intubation Type: IV induction Ventilation: Mask ventilation without difficulty Laryngoscope Size: Mac and 3 Grade View: Grade I Tube type: Oral Number of attempts: 1 Airway Equipment and Method: Stylet and Oral airway Placement Confirmation: ETT inserted through vocal cords under direct vision,  positive ETCO2 and breath sounds checked- equal and bilateral Secured at: 22 cm Tube secured with: Tape Dental Injury: Teeth and Oropharynx as per pre-operative assessment

## 2016-10-30 NOTE — Transfer of Care (Signed)
Immediate Anesthesia Transfer of Care Note  Patient: Ashley Barajas  Procedure(s) Performed: Procedure(s): WIDE LOCAL EXCISION AND ADVANCEMENT FLAP CLOSURE DEFECT RIGHT BUTTOCK (Right)  Patient Location: PACU  Anesthesia Type:General  Level of Consciousness: awake, alert  and oriented  Airway & Oxygen Therapy: Patient Spontanous Breathing and Patient connected to face mask oxygen  Post-op Assessment: Report given to RN and Post -op Vital signs reviewed and stable  Post vital signs: Reviewed and stable  Last Vitals:  Vitals:   10/30/16 0746  BP: 105/68  Pulse: 60  Resp: 18  Temp: 36.9 C    Last Pain:  Vitals:   10/30/16 0746  TempSrc: Oral         Complications: No apparent anesthesia complications

## 2016-10-30 NOTE — Discharge Instructions (Addendum)
[facility] °Office Phone Number [phone] ° ° POST OP INSTRUCTIONS ° °Always review your discharge instruction sheet given to you by the facility where your surgery was performed. ° °IF YOU HAVE DISABILITY OR FAMILY LEAVE FORMS, YOU MUST BRING THEM TO THE OFFICE FOR PROCESSING.  DO NOT GIVE THEM TO YOUR DOCTOR. ° °1. A prescription for pain medication may be given to you upon discharge.  Take your pain medication as prescribed, if needed.  If narcotic pain medicine is not needed, then you may take acetaminophen (Tylenol) or ibuprofen (Advil) as needed. °2. Take your usually prescribed medications unless otherwise directed °3. If you need a refill on your pain medication, please contact your pharmacy.  They will contact our office to request authorization.  Prescriptions will not be filled after 5pm or on week-ends. °4. You should eat very light the first 24 hours after surgery, such as soup, crackers, pudding, etc.  Resume your normal diet the day after surgery °5. It is common to experience some constipation if taking pain medication after surgery.  Increasing fluid intake and taking a stool softener will usually help or prevent this problem from occurring.  A mild laxative (Milk of Magnesia or Miralax) should be taken according to package directions if there are no bowel movements after 48 hours. °6. You may shower in 48 hours.  The surgical glue will flake off in 2-3 weeks.   °7. ACTIVITIES:  No strenuous activity or heavy lifting for 1 week.   °a. You may drive when you no longer are taking prescription pain medication, you can comfortably wear a seatbelt, and you can safely maneuver your car and apply brakes. °b. RETURN TO WORK:  __________to be determined.  _______________ °You should see your doctor in the office for a follow-up appointment approximately three-four weeks after your surgery.   ° °WHEN TO CALL YOUR DOCTOR: °1. Fever over 101.0 °2. Nausea and/or vomiting. °3. Extreme swelling  or bruising. °4. Continued bleeding from incision. °5. Increased pain, redness, or drainage from the incision. ° °The clinic staff is available to answer your questions during regular business hours.  Please don’t hesitate to call and ask to speak to one of the nurses for clinical concerns.  If you have a medical emergency, go to the nearest emergency room or call 911.  A surgeon from [facility] is always on call at the hospital. ° °For further questions, please visit [email or web address]  ° ° ° ° °Post Anesthesia Home Care Instructions ° °Activity: °Get plenty of rest for the remainder of the day. A responsible individual must stay with you for 24 hours following the procedure.  °For the next 24 hours, DO NOT: °-Drive a car °-Operate machinery °-Drink alcoholic beverages °-Take any medication unless instructed by your physician °-Make any legal decisions or sign important papers. ° °Meals: °Start with liquid foods such as gelatin or soup. Progress to regular foods as tolerated. Avoid greasy, spicy, heavy foods. If nausea and/or vomiting occur, drink only clear liquids until the nausea and/or vomiting subsides. Call your physician if vomiting continues. ° °Special Instructions/Symptoms: °Your throat may feel dry or sore from the anesthesia or the breathing tube placed in your throat during surgery. If this causes discomfort, gargle with warm salt water. The discomfort should disappear within 24 hours. ° °If you had a scopolamine patch placed behind your ear for the management of post- operative nausea and/or vomiting: ° °1. The medication in the patch is effective for 72   hours, after which it should be removed.  Wrap patch in a tissue and discard in the trash. Wash hands thoroughly with soap and water. °2. You may remove the patch earlier than 72 hours if you experience unpleasant side effects which may include dry mouth, dizziness or visual disturbances. °3. Avoid touching the patch. Wash your  hands with soap and water after contact with the patch. °  ° °

## 2016-10-30 NOTE — Anesthesia Postprocedure Evaluation (Addendum)
Anesthesia Post Note  Patient: Ashley Barajas  Procedure(s) Performed: Procedure(s) (LRB): WIDE LOCAL EXCISION AND ADVANCEMENT FLAP CLOSURE DEFECT RIGHT BUTTOCK (Right)  Patient location during evaluation: PACU Anesthesia Type: General Level of consciousness: awake, awake and alert and oriented Pain management: pain level controlled Vital Signs Assessment: post-procedure vital signs reviewed and stable Respiratory status: spontaneous breathing, nonlabored ventilation and respiratory function stable Cardiovascular status: blood pressure returned to baseline Anesthetic complications: no       Last Vitals:  Vitals:   10/30/16 1022 10/30/16 1044  BP:  110/83  Pulse: 90 64  Resp: 18 16  Temp:  36.4 C    Last Pain:  Vitals:   10/30/16 1044  TempSrc: Oral  PainSc: 0-No pain                 Mardie Kellen COKER

## 2016-10-30 NOTE — Anesthesia Preprocedure Evaluation (Signed)
Anesthesia Evaluation  Patient identified by MRN, date of birth, ID band Patient awake    Reviewed: Allergy & Precautions, NPO status , Patient's Chart, lab work & pertinent test results  Airway Mallampati: II  TM Distance: >3 FB Neck ROM: Full    Dental  (+) Teeth Intact, Dental Advisory Given   Pulmonary former smoker,    breath sounds clear to auscultation       Cardiovascular  Rhythm:Regular Rate:Normal     Neuro/Psych    GI/Hepatic   Endo/Other    Renal/GU      Musculoskeletal   Abdominal   Peds  Hematology   Anesthesia Other Findings   Reproductive/Obstetrics                             Anesthesia Physical Anesthesia Plan  ASA: I  Anesthesia Plan: General   Post-op Pain Management:    Induction: Intravenous  Airway Management Planned: Oral ETT  Additional Equipment:   Intra-op Plan:   Post-operative Plan: Extubation in OR  Informed Consent: I have reviewed the patients History and Physical, chart, labs and discussed the procedure including the risks, benefits and alternatives for the proposed anesthesia with the patient or authorized representative who has indicated his/her understanding and acceptance.   Dental advisory given  Plan Discussed with: CRNA and Anesthesiologist  Anesthesia Plan Comments:         Anesthesia Quick Evaluation

## 2016-10-30 NOTE — Interval H&P Note (Signed)
History and Physical Interval Note:  10/30/2016 8:34 AM  Ashley Barajas  has presented today for surgery, with the diagnosis of Right buttock melanoma  The various methods of treatment have been discussed with the patient and family. After consideration of risks, benefits and other options for treatment, the patient has consented to  Procedure(s): WIDE LOCAL EXCISION AND ADVANCEMENT FLAP CLOSURE DEFECT 3 X 3 CM (Right) as a surgical intervention .  The patient's history has been reviewed, patient examined, no change in status, stable for surgery.  I have reviewed the patient's chart and labs.  Questions were answered to the patient's satisfaction.     Foster Frericks

## 2016-10-30 NOTE — Op Note (Signed)
PRE-OPERATIVE DIAGNOSIS: [id number] right buttock melanoma  POST-OPERATIVE DIAGNOSIS:  Same  PROCEDURE:  Procedure(s): Wide local excision 1 cm margins, advancement flap closure for defect 3 cm x 5 cm  SURGEON:  Surgeon(s): Stark Klein, MD  ANESTHESIA:   local and general  DRAINS: none   LOCAL MEDICATIONS USED:  MARCAINE    and XYLOCAINE   SPECIMEN:  Source of Specimen:  Wide local excision right buttock melanoma  FINDINGS:  Margins grossly negative.    DISPOSITION OF SPECIMEN:  PATHOLOGY  COUNTS:  YES  PLAN OF CARE: Discharge to home after PACU  PATIENT DISPOSITION:  PACU - hemodynamically stable.    PROCEDURE:   Pt was identified in the holding area, taken to the OR, and general anesthesia was induced on the stretcher.  She was then flipped into the prone position.  Time out was performed according to the surgical safety checklist.  When all was correct, we continued.    The patient's right buttock was prepped and draped in sterile fashion.  The site of themelanoma was identified and 1 cm margins were marked out.  20 mL local was administered under the melanoma and the adjacent tissue.  This was made into an ellipse in order to facilitate closure.  A #10 blade was used to incise the skin around the melanoma.  The cautery was used to take the dissection down to the fascia.  The skin was marked in situ with silk suture.  The cautery was used to take the specimen off the fascia, and it was passed off the table.    Skin hooks were used to elevate the edges of the incision and the skin was freed up in all directions.  This was pulled together in a transverse orientation.   Deep interrupted 2-0 vicryl sutures were placed to relieve tension.  The skin was then reapproximated with 3-0 interrupted vicryl deep dermal sutures and 4-0 monocryl running subcuticular sutures.  Two 2-0 nylon horizontal mattress sutures were placed as well.  The wound was dressed with Benzoin, steristrips, gauze, and  tegaderm.    Needle, sponge, and instrument counts were correct.  The patient was awakened from anesthesia and taken to the PACU in stable condition.

## 2016-10-31 ENCOUNTER — Encounter (HOSPITAL_BASED_OUTPATIENT_CLINIC_OR_DEPARTMENT_OTHER): Payer: Self-pay | Admitting: General Surgery

## 2016-11-03 NOTE — Progress Notes (Signed)
Please let patient know margins are negative!

## 2017-03-05 NOTE — Addendum Note (Signed)
Addendum  created 03/05/17 1326 by Roberts Gaudy, MD   Sign clinical note

## 2017-04-08 DIAGNOSIS — L570 Actinic keratosis: Secondary | ICD-10-CM | POA: Diagnosis not present

## 2017-04-08 DIAGNOSIS — Z23 Encounter for immunization: Secondary | ICD-10-CM | POA: Diagnosis not present

## 2017-04-08 DIAGNOSIS — L814 Other melanin hyperpigmentation: Secondary | ICD-10-CM | POA: Diagnosis not present

## 2017-04-08 DIAGNOSIS — Z85820 Personal history of malignant melanoma of skin: Secondary | ICD-10-CM | POA: Diagnosis not present

## 2017-04-08 DIAGNOSIS — L905 Scar conditions and fibrosis of skin: Secondary | ICD-10-CM | POA: Diagnosis not present

## 2017-04-08 DIAGNOSIS — D225 Melanocytic nevi of trunk: Secondary | ICD-10-CM | POA: Diagnosis not present

## 2017-04-08 DIAGNOSIS — D1801 Hemangioma of skin and subcutaneous tissue: Secondary | ICD-10-CM | POA: Diagnosis not present

## 2017-04-10 ENCOUNTER — Telehealth: Payer: 59 | Admitting: Family

## 2017-04-10 DIAGNOSIS — J329 Chronic sinusitis, unspecified: Secondary | ICD-10-CM | POA: Diagnosis not present

## 2017-04-10 DIAGNOSIS — B9789 Other viral agents as the cause of diseases classified elsewhere: Secondary | ICD-10-CM | POA: Diagnosis not present

## 2017-04-10 MED ORDER — AMOXICILLIN-POT CLAVULANATE 875-125 MG PO TABS: 1.0000 | ORAL_TABLET | Freq: Two times a day (BID) | ORAL | 0 refills | Status: DC

## 2017-04-10 MED ORDER — FLUTICASONE PROPIONATE 50 MCG/ACT NA SUSP: 2.0000 | Freq: Every day | NASAL | 6 refills | Status: DC

## 2017-04-10 NOTE — Progress Notes (Signed)
We are sorry that you are not feeling well.  Here is how we plan to help!  Based on what you have shared with me it looks like you have sinusitis.  Sinusitis is inflammation and infection in the sinus cavities of the head.  Based on your presentation I believe you most likely have Acute Viral Sinusitis.This is an infection most likely caused by a virus. There is not specific treatment for viral sinusitis other than to help you with the symptoms until the infection runs its course.  You may use an oral decongestant such as Mucinex D or if you have glaucoma or high blood pressure use plain Mucinex. Saline nasal spray help and can safely be used as often as needed for congestion, I have prescribed: Fluticasone nasal spray two sprays in each nostril twice a day   Providers prescribe antibiotics to treat infections caused by bacteria. Antibiotics are very powerful in treating bacterial infections when they are used properly. To maintain their effectiveness, they should be used only when necessary. Overuse of antibiotics has resulted in the development of superbugs that are resistant to treatment!    After careful review of your answers, I would not recommend an antibiotic for your condition.  Antibiotics are not effective against viruses and therefore should not be used to treat them. Common examples of infections caused by viruses include colds and flu   Some authorities believe that zinc sprays or the use of Echinacea may shorten the course of your symptoms.  Sinus infections are not as easily transmitted as other respiratory infection, however we still recommend that you avoid close contact with loved ones, especially the very young and elderly.  Remember to wash your hands thoroughly throughout the day as this is the number one way to prevent the spread of infection!  Home Care:  Only take medications as instructed by your medical team.  Complete the entire course of an antibiotic.  Do not take  these medications with alcohol.  A steam or ultrasonic humidifier can help congestion.  You can place a towel over your head and breathe in the steam from hot water coming from a faucet.  Avoid close contacts especially the very young and the elderly.  Cover your mouth when you cough or sneeze.  Always remember to wash your hands.  Get Help Right Away If:  You develop worsening fever or sinus pain.  You develop a severe head ache or visual changes.  Your symptoms persist after you have completed your treatment plan.  Make sure you  Understand these instructions.  Will watch your condition.  Will get help right away if you are not doing well or get worse.  Your e-visit answers were reviewed by a board certified advanced clinical practitioner to complete your personal care plan.  Depending on the condition, your plan could have included both over the counter or prescription medications.  If there is a problem please reply  once you have received a response from your provider.  Your safety is important to [place].  If you have drug allergies check your prescription carefully.    You can use MyChart to ask questions about today's visit, request a non-urgent call back, or ask for a work or school excuse for 24 hours related to this e-Visit. If it has been greater than 24 hours you will need to follow up with your provider, or enter a new e-Visit to address those concerns.  You will get an e-mail in the next two  days asking about your experience.  I hope that your e-visit has been valuable and will speed your recovery. Thank you for using e-visits.

## 2017-04-10 NOTE — Addendum Note (Signed)
Addended by: Evelina Dun A on: 04/10/2017 12:07 PM   Modules accepted: Orders

## 2017-09-13 NOTE — Progress Notes (Signed)
Subjective:    Patient ID: Ashley Barajas, female    DOB: 1981-03-16, 37 y.o.   MRN: [id number]  HPI The patient is here for an acute visit for extreme fatigue.  Extreme fatigue:  Some days she can feel completely depleted - she has no energy can could go back to bed after drinking her coffee.  She has difficulty focusing at times and feels her memory is not as good.  She forgets things.  She will take a nap when she is off and she will sometimes feel refreshed.  Sometimes she feels tired first thing in the morning, but will always feel exhausted in the mid afternoon.      Goes to bed at same time, gets up at same time.  She thinks her quality of sleep is good.  She may wake up a couple of times, but goes right back to sleep.  Her husband has never commented about her sleep.   Things that she enjoys doing she has to push herself do to.  She just does not have the energy.  She does not think she is depressed.   Whole leg pain:  Both of her legs ache more than they don't.  They sometimes feel like they will break apart. Worse with and after working her 12 hr shifts. They feel better her days off.  Today they feel ok.   She bicycles and run/walks at least 3/week.    Medications and allergies reviewed with patient and updated if appropriate.  Patient Active Problem List   Diagnosis Date Noted  . Memory difficulties 09/14/2017  . Melanoma of skin ([place]) 10/26/2016  . Migraines 05/13/2016  . Greater trochanteric bursitis of right hip 05/07/2016  . Leg pain, bilateral 11/12/2015  . 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 Depression 04/30/2015  . Fatigue 04/30/2015  . Transient gluten sensitivity 04/30/2015  . Generalized anxiety disorder 04/30/2015    No current outpatient medications on file prior to visit.   No current facility-administered medications on file prior to visit.     Past Medical History:  Diagnosis Date  . Amenorrhea, unspecified    states no  menses in 9 yrs.  . Anemia   . Constipation   . ITP (idiopathic thrombocytopenic purpura)   . Melanoma of buttock ([place]) 10/2016   right  . Migraines     Past Surgical History:  Procedure Laterality Date  . MELANOMA EXCISION Right 10/30/2016   Procedure: WIDE LOCAL EXCISION AND ADVANCEMENT FLAP CLOSURE DEFECT RIGHT BUTTOCK;  Surgeon: Stark Klein, MD;  Location: [facility];  Service: General;  Laterality: Right;  . NO PAST SURGERIES      Social History   Socioeconomic History  . Marital status: Married    Spouse name: Not on file  . Number of children: 2  . Years of education: Not on file  . Highest education level: Not on file  Social Needs  . Financial resource strain: Not on file  . Food insecurity - worry: Not on file  . Food insecurity - inability: Not on file  . Transportation needs - medical: Not on file  . Transportation needs - non-medical: Not on file  Occupational History  . Not on file  Tobacco Use  . Smoking status: Former Smoker    Last attempt to quit: 07/13/2005    Years since quitting: 12.1  . Smokeless tobacco: Never Used  Substance and Sexual Activity  . Alcohol use: No  . Drug  use: No  . Sexual activity: Not on file  Other Topics Concern  . Not on file  Social History Narrative   Nurse - long term care      Two children: 6,11    Family History  Problem Relation Age of Onset  . Hypertension Mother   . Hypertension Father   . Cancer Son        leukemia    Review of Systems  Constitutional: Positive for diaphoresis (sometimes at night) and fatigue. Negative for appetite change, chills, fever and unexpected weight change.  HENT: Positive for congestion (chronic). Negative for ear pain, sinus pain and sore throat.   Eyes: Negative for visual disturbance.  Respiratory: Negative for cough, shortness of breath and wheezing.   Cardiovascular: Positive for palpitations (occ heart jumps). Negative for chest pain and leg swelling.    Gastrointestinal: Positive for constipation (controlled - miralax). Negative for abdominal pain, blood in stool and nausea.       No gerd  Genitourinary: Negative for dysuria and hematuria.  Musculoskeletal: Positive for myalgias. Negative for arthralgias, back pain and joint swelling.  Skin: Negative for color change and rash.  Neurological: Positive for numbness (sometimes in feet/legs) and headaches (rare). Negative for dizziness and light-headedness.  Psychiatric/Behavioral: Negative for dysphoric mood and sleep disturbance. The patient is not nervous/anxious.        Objective:   Vitals:   09/14/17 0956  BP: 114/74  Pulse: 68  Resp: 16  Temp: 98.2 F (36.8 C)  SpO2: 98%   Wt Readings from Last 3 Encounters:  09/14/17 133 lb (60.3 kg)  10/30/16 134 lb (60.8 kg)  09/11/16 140 lb (63.5 kg)   Body mass index is 23.56 kg/m.   Physical Exam  Constitutional: She is oriented to person, place, and time. She appears well-developed and well-nourished. No distress.  HENT:  Head: Normocephalic and atraumatic.  Eyes: Conjunctivae are normal.  Neck: Neck supple. No tracheal deviation present. No thyromegaly present.  Cardiovascular: Normal rate, regular rhythm and normal heart sounds.  No murmur heard. Pulmonary/Chest: Effort normal and breath sounds normal. No respiratory distress. She has no wheezes. She has no rales.  Abdominal: Soft. She exhibits no distension. There is no tenderness. There is no rebound and no guarding.  Musculoskeletal: She exhibits no edema.  Lymphadenopathy:    She has no cervical adenopathy.  Neurological: She is alert and oriented to person, place, and time.  Skin: Skin is dry. She is not diaphoretic.  Psychiatric: She has a normal mood and affect. Her behavior is normal. Judgment and thought content normal.           Assessment & Plan:    See Problem List for Assessment and Plan of chronic medical problems.

## 2017-09-14 ENCOUNTER — Encounter: Payer: Self-pay | Admitting: Internal Medicine

## 2017-09-14 ENCOUNTER — Other Ambulatory Visit (INDEPENDENT_AMBULATORY_CARE_PROVIDER_SITE_OTHER): Payer: No Typology Code available for payment source

## 2017-09-14 ENCOUNTER — Ambulatory Visit (INDEPENDENT_AMBULATORY_CARE_PROVIDER_SITE_OTHER): Payer: No Typology Code available for payment source | Admitting: Internal Medicine

## 2017-09-14 VITALS — BP 114/74 | HR 68 | Temp 98.2°F | Resp 16 | Wt 133.0 lb

## 2017-09-14 DIAGNOSIS — M79605 Pain in left leg: Secondary | ICD-10-CM

## 2017-09-14 DIAGNOSIS — R413 Other amnesia: Secondary | ICD-10-CM | POA: Diagnosis not present

## 2017-09-14 DIAGNOSIS — M79604 Pain in right leg: Secondary | ICD-10-CM

## 2017-09-14 DIAGNOSIS — R5382 Chronic fatigue, unspecified: Secondary | ICD-10-CM

## 2017-09-14 LAB — CBC WITH DIFFERENTIAL/PLATELET
BASOS ABS: 0 10*3/uL (ref 0.0–0.1)
Basophils Relative: 0.6 % (ref 0.0–3.0)
EOS ABS: 0.1 10*3/uL (ref 0.0–0.7)
Eosinophils Relative: 2.5 % (ref 0.0–5.0)
HEMATOCRIT: 39.9 % (ref 36.0–46.0)
Hemoglobin: 13.4 g/dL (ref 12.0–15.0)
LYMPHS PCT: 32.1 % (ref 12.0–46.0)
Lymphs Abs: 1.6 10*3/uL (ref 0.7–4.0)
MCHC: 33.6 g/dL (ref 30.0–36.0)
MCV: 88.6 fl (ref 78.0–100.0)
MONO ABS: 0.4 10*3/uL (ref 0.1–1.0)
Monocytes Relative: 7.4 % (ref 3.0–12.0)
NEUTROS ABS: 2.8 10*3/uL (ref 1.4–7.7)
Neutrophils Relative %: 57.4 % (ref 43.0–77.0)
Platelets: 147 10*3/uL — ABNORMAL LOW (ref 150.0–400.0)
RBC: 4.51 Mil/uL (ref 3.87–5.11)
RDW: 13.5 % (ref 11.5–15.5)
WBC: 4.9 10*3/uL (ref 4.0–10.5)

## 2017-09-14 LAB — COMPREHENSIVE METABOLIC PANEL
ALT: 21 U/L (ref 0–35)
AST: 19 U/L (ref 0–37)
Albumin: 4.5 g/dL (ref 3.5–5.2)
Alkaline Phosphatase: 34 U/L — ABNORMAL LOW (ref 39–117)
BILIRUBIN TOTAL: 0.5 mg/dL (ref 0.2–1.2)
BUN: 19 mg/dL (ref 6–23)
CHLORIDE: 102 meq/L (ref 96–112)
CO2: 28 mEq/L (ref 19–32)
CREATININE: 0.69 mg/dL (ref 0.40–1.20)
Calcium: 10.1 mg/dL (ref 8.4–10.5)
GFR: 103.07 mL/min (ref >60.00)
GLUCOSE: 89 mg/dL (ref 70–99)
Potassium: 4.1 mEq/L (ref 3.5–5.1)
SODIUM: 138 meq/L (ref 135–145)
Total Protein: 8.1 g/dL (ref 6.0–8.3)

## 2017-09-14 LAB — SEDIMENTATION RATE: Sed Rate: 13 mm/hr (ref 0–20)

## 2017-09-14 LAB — CK: CK TOTAL: 86 U/L (ref 7–177)

## 2017-09-14 LAB — TSH: TSH: 1.34 u[IU]/mL (ref 0.35–4.50)

## 2017-09-14 LAB — FERRITIN: FERRITIN: 61.3 ng/mL (ref 10.0–291.0)

## 2017-09-14 LAB — VITAMIN B12: VITAMIN B 12: 662 pg/mL (ref 211–911)

## 2017-09-14 LAB — IRON: IRON: 137 ug/dL (ref 42–145)

## 2017-09-14 LAB — C-REACTIVE PROTEIN: CRP: 0.1 mg/dL — AB (ref 0.5–20.0)

## 2017-09-14 MED ORDER — RIZATRIPTAN BENZOATE 10 MG PO TABS: 10.0000 mg | ORAL_TABLET | ORAL | 8 refills | Status: DC | PRN

## 2017-09-14 MED ORDER — BUPROPION HCL ER (XL) 150 MG PO TB24: 150.0000 mg | ORAL_TABLET | Freq: Every day | ORAL | 5 refills | Status: DC

## 2017-09-14 NOTE — Assessment & Plan Note (Signed)
Chronic ? cause Will check labs, including b12, tsh, cbc, cmp, iron, ferritin Will refer to pulm for sleep study Will start wellbutrin - ? Depression - she does not seem depressed, but there may be some mild depression and we both feel this is worth a try Continue regular exercise, healthy diet

## 2017-09-14 NOTE — Assessment & Plan Note (Signed)
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

## 2017-09-14 NOTE — Patient Instructions (Signed)
  Test(s) ordered today. Your results will be released to [place] (or called to you) after review, usually within 72hours after test completion. If any changes need to be made, you will be notified at that same time.   Medications reviewed and updated.  Changes include trying Wellbutrin.   Your prescription(s) have been submitted to your pharmacy. Please take as directed and contact our office if you believe you are having problem(s) with the medication(s).  A referral was ordered for pulmonary to evaluate for sleep disorders.

## 2017-09-14 NOTE — Assessment & Plan Note (Signed)
?   Related to fatigue, ? Good sleep quality, ? Depression, ? Vitamin def, tsh low or life itself - she is working and has two kids Will check labs, including b12, tsh, cbc, cmp Will refer to pulm for sleep study Will start wellbutrin - ? Depression - she does not seem depressed, but there may be some mild depression and we both feel this is worth a try

## 2017-09-15 LAB — ANA: Anti Nuclear Antibody(ANA): NEGATIVE

## 2017-09-17 ENCOUNTER — Encounter: Payer: Self-pay | Admitting: Internal Medicine

## 2017-09-22 ENCOUNTER — Telehealth: Payer: Self-pay | Admitting: Internal Medicine

## 2017-09-22 DIAGNOSIS — J329 Chronic sinusitis, unspecified: Secondary | ICD-10-CM

## 2017-09-22 DIAGNOSIS — C439 Malignant melanoma of skin, unspecified: Secondary | ICD-10-CM

## 2017-09-22 NOTE — Telephone Encounter (Signed)
Copied from [place] [phone] [id number]. Topic: General - Other >> Sep 22, 2017  4:02 PM Carolyn Stare wrote: Pt call to req a referral to  [facility] ENT Dr Tami Ribas for ear and sinus issue  [place]  for [place] [facility]    **Please advise if you would like for patient to make an appointment. Thank you.

## 2017-09-23 NOTE — Telephone Encounter (Signed)
Referrals pending, pt was last seen on 09/14/17

## 2017-11-09 ENCOUNTER — Ambulatory Visit: Payer: No Typology Code available for payment source | Admitting: Pulmonary Disease

## 2017-11-09 ENCOUNTER — Encounter: Payer: Self-pay | Admitting: Pulmonary Disease

## 2017-11-09 VITALS — BP 138/82 | HR 72 | Ht 63.0 in | Wt 128.0 lb

## 2017-11-09 DIAGNOSIS — G4719 Other hypersomnia: Secondary | ICD-10-CM

## 2017-11-09 NOTE — Patient Instructions (Signed)
Will arrange for home sleep study Will call to arrange for follow up after sleep study reviewed  

## 2017-11-09 NOTE — Progress Notes (Signed)
[facility]  Chief Complaint  Patient presents with  . New Consult    Chronic fatigue     Vital signs: BP 138/82 (BP Location: Left Arm, Cuff Size: Normal)   Pulse 72   Ht 5\' 3"  (1.6 m)   Wt 128 lb (58.1 kg)   SpO2 94%   BMI 22.67 kg/m   History of Present Illness: Ashley Barajas is a 37 y.o. female for evaluation of sleep problems.  She has noticed trouble feeling fatigued for several years.  She feels sleepy during the day and can fall asleep easily when she is sitting quiet.  She is not sure if she snores.  She has trouble sleeping on her back, and wakes up during dreams.  She also drools at time when asleep.  She does clench her teeth at night.  She goes to sleep at 10 pm.  She falls asleep quickly.  She is a restless sleeper.  She gets out of bed at 5 am.  She feels tired in the morning.  She denies morning headache.  She does not use anything to help her stay awake.  She takes melatonin at bedtime, but this hasn't made much difference.  She denies sleep walking, sleep talking, bruxism, or nightmares.  There is no history of restless legs.  She denies sleep hallucinations, sleep paralysis, or cataplexy.  The Epworth score is 11 out of 24.   Physical Exam:  General - pleasant Eyes - pupils reactive ENT - no sinus tenderness, no oral exudate, no LAN, over bite, 2+ tonsils, enlarged tongue, MP 3 Cardiac - regular, no murmur Chest - no wheeze, rales Abd - soft, non tender Ext - no edema Skin - no rashes Neuro - normal strength Psych - normal mood   CMP Latest Ref Rng & Units 09/14/2017 04/30/2015  Glucose 70 - 99 mg/dL 89 73  BUN 6 - 23 mg/dL 19 13  Creatinine 0.40 - 1.20 mg/dL 0.69 0.74  Sodium 135 - 145 mEq/L 138 139  Potassium 3.5 - 5.1 mEq/L 4.1 4.1  Chloride 96 - 112 mEq/L 102 103  CO2 19 - 32 mEq/L 28 29  Calcium 8.4 - 10.5 mg/dL 10.1 9.8  Total Protein 6.0 - 8.3 g/dL 8.1 7.7  Total Bilirubin 0.2 - 1.2 mg/dL 0.5 0.5    Alkaline Phos 39 - 117 U/L 34(L) 42  AST 0 - 37 U/L 19 17  ALT 0 - 35 U/L 21 16    CBC Latest Ref Rng & Units 09/14/2017 10/27/2016 04/30/2015  WBC 4.0 - 10.5 K/uL 4.9 6.8 4.7  Hemoglobin 12.0 - 15.0 g/dL 13.4 12.3 13.0  Hematocrit 36.0 - 46.0 % 39.9 37.2 38.8  Platelets 150.0 - 400.0 K/uL 147.0(L) 159 147.0(L)    Lab Results  Component Value Date   TSH 1.34 09/14/2017    Lab Results  Component Value Date   ANA NEGATIVE 09/14/2017    Lab Results  Component Value Date   ESRSEDRATE 13 09/14/2017    Iron/TIBC/Ferritin/ %Sat    Component Value Date/Time   IRON 137 09/14/2017 1042   FERRITIN 61.3 09/14/2017 1042     Discussion: She has sleep disruption, teeth clenching at night, and daytime sleepiness.  Her upper airway anatomy is suggestive of sleep disordered breathing.  It is possible she could have sleep apnea.    We discussed how sleep apnea can affect various health problems, including risks for hypertension, cardiovascular disease, and diabetes.  We also  discussed how sleep disruption can increase risks for accidents, such as while driving.  Weight loss as a means of improving sleep apnea was also reviewed.  Additional treatment options discussed were CPAP therapy, oral appliance, and surgical intervention.  Assessment/Plan:  Excessive daytime sleepiness. - will arrange for home sleep study   Patient Instructions  Will arrange for home sleep study Will call to arrange for follow up after sleep study reviewed     Chesley Mires, MD [place] 11/09/2017, 11:19 AM  Flow Sheet  Sleep tests:  Review of Systems: Constitutional: Negative for fever and unexpected weight change.  HENT: Positive for congestion, nosebleeds and sinus pressure. Negative for dental problem, ear pain, postnasal drip, rhinorrhea, sneezing, sore throat and trouble swallowing.   Eyes: Negative for redness and itching.  Respiratory: Negative for cough, chest tightness,  shortness of breath and wheezing.   Cardiovascular: Negative for palpitations and leg swelling.  Gastrointestinal: Negative for nausea and vomiting.  Genitourinary: Negative for dysuria.  Musculoskeletal: Negative for joint swelling.  Skin: Negative for rash.  Allergic/Immunologic: Positive for environmental allergies and food allergies. Negative for immunocompromised state.  Neurological: Positive for headaches.  Hematological: Does not bruise/bleed easily.  Psychiatric/Behavioral: Negative for dysphoric mood. The patient is not nervous/anxious.    Past Medical History: She  has a past medical history of Amenorrhea, unspecified, Anemia, Constipation, ITP (idiopathic thrombocytopenic purpura), Melanoma of buttock ([place]) (10/2016), and Migraines.  Past Surgical History: She  has a past surgical history that includes No past surgeries and Melanoma excision (Right, 10/30/2016).  Family History: Her family history includes Cancer in her son; Hypertension in her father and mother.  Social History: She  reports that she quit smoking about 12 years ago. She has never used smokeless tobacco. She reports that she does not drink alcohol or use drugs.  Medications: Allergies as of 11/09/2017      Reactions   Adhesive [tape] Rash   Erythromycin Other (See Comments)   UNKNOWN - WAS AS A CHILD      Medication List        Accurate as of 11/09/17 11:19 AM. Always use your most recent med list.          buPROPion 150 MG 24 hr tablet Commonly known as:  WELLBUTRIN XL Take 1 tablet (150 mg total) by mouth daily.   fexofenadine 180 MG tablet Commonly known as:  ALLEGRA Take 180 mg by mouth daily as needed for allergies or rhinitis.   Melatonin 1 MG Tabs Take 1.5 mg by mouth at bedtime.   rizatriptan 10 MG tablet Commonly known as:  MAXALT Take 1 tablet (10 mg total) by mouth as needed for migraine. May repeat in 2 hours if needed

## 2017-11-09 NOTE — Progress Notes (Signed)
   Subjective:    Patient ID: Ashley Barajas, female    DOB: [birth year], 37 y.o.   MRN: [id number]  HPI    Review of Systems  Constitutional: Negative for fever and unexpected weight change.  HENT: Positive for congestion, nosebleeds and sinus pressure. Negative for dental problem, ear pain, postnasal drip, rhinorrhea, sneezing, sore throat and trouble swallowing.   Eyes: Negative for redness and itching.  Respiratory: Negative for cough, chest tightness, shortness of breath and wheezing.   Cardiovascular: Negative for palpitations and leg swelling.  Gastrointestinal: Negative for nausea and vomiting.  Genitourinary: Negative for dysuria.  Musculoskeletal: Negative for joint swelling.  Skin: Negative for rash.  Allergic/Immunologic: Positive for environmental allergies and food allergies. Negative for immunocompromised state.  Neurological: Positive for headaches.  Hematological: Does not bruise/bleed easily.  Psychiatric/Behavioral: Negative for dysphoric mood. The patient is not nervous/anxious.        Objective:   Physical Exam        Assessment & Plan:

## 2017-12-02 DIAGNOSIS — R0683 Snoring: Secondary | ICD-10-CM | POA: Diagnosis not present

## 2017-12-04 ENCOUNTER — Other Ambulatory Visit: Payer: Self-pay | Admitting: *Deleted

## 2017-12-04 DIAGNOSIS — G4719 Other hypersomnia: Secondary | ICD-10-CM

## 2017-12-09 DIAGNOSIS — R0683 Snoring: Secondary | ICD-10-CM | POA: Diagnosis not present

## 2017-12-10 ENCOUNTER — Telehealth: Payer: Self-pay | Admitting: Pulmonary Disease

## 2017-12-10 DIAGNOSIS — G4719 Other hypersomnia: Secondary | ICD-10-CM | POA: Insufficient documentation

## 2017-12-10 NOTE — Telephone Encounter (Signed)
HST 12/02/17 >> AHI 0.8, SaO2 low 83%   Please let her know that sleep study didn't show sleep apnea.  Please schedule ROV with me to discuss plan for further assessment of her daytime sleepiness.

## 2017-12-11 NOTE — Telephone Encounter (Signed)
Appointment made for 12/21/17 at 10am.

## 2017-12-11 NOTE — Telephone Encounter (Signed)
Results went over and nothing further needed at this time.

## 2017-12-21 ENCOUNTER — Encounter: Payer: Self-pay | Admitting: Pulmonary Disease

## 2017-12-21 ENCOUNTER — Ambulatory Visit: Payer: No Typology Code available for payment source | Admitting: Pulmonary Disease

## 2017-12-21 VITALS — BP 104/70 | HR 76 | Ht 63.0 in | Wt 125.6 lb

## 2017-12-21 DIAGNOSIS — G4719 Other hypersomnia: Secondary | ICD-10-CM | POA: Diagnosis not present

## 2017-12-21 NOTE — Patient Instructions (Signed)
Will arrange for overnight sleep study followed by daytime sleep study and schedule follow up after these are done

## 2017-12-21 NOTE — Progress Notes (Signed)
[facility]  Chief Complaint  Patient presents with  . Follow-up    Pt has excessive daytime sleepiness, toss and turn at night. Pt cannot sleep at night and just stays tired.    Vital signs: BP 104/70 (BP Location: Left Arm, Cuff Size: Normal)   Pulse 76   Ht 5\' 3"  (1.6 m)   Wt 125 lb 9.6 oz (57 kg)   SpO2 96%   BMI 22.25 kg/m   History of Present Illness: Ashley Barajas is a 37 y.o. female with daytime sleepiness.  She had home sleep study.  Didn't show sleep apnea.  She continues to have sleep disruption, and daytime sleepiness.  Denies leg cramps or other funny leg symptoms.  She gets about 7 to 8 hours in bed per night.  Physical Exam:  General - pleasant Eyes - pupils reactive ENT - no sinus tenderness, no oral exudate, no LAN, MP 3 Cardiac - regular, no murmur Chest - no wheeze, rales Abd - soft, non tender Ext - no edema Skin - no rashes Neuro - normal strength Psych - normal mood   Assessment/Plan:  Excessive daytime sleepiness. - home sleep study was non diagnostic - need to assess for narcolepsy or other causes of sleep disruption and daytime sleepiness - will arrange for NPSG followed by [facility]   Patient Instructions  Will arrange for overnight sleep study followed by daytime sleep study and schedule follow up after these are done    Chesley Mires, MD [place] 12/21/2017, 10:49 AM  Flow Sheet  Sleep tests: HST 12/02/17 >> AHI 0.8, SaO2 low 83%  Past Medical History: She  has a past medical history of Amenorrhea, unspecified, Anemia, Constipation, ITP (idiopathic thrombocytopenic purpura), Melanoma of buttock ([place]) (10/2016), and Migraines.  Past Surgical History: She  has a past surgical history that includes No past surgeries and Melanoma excision (Right, 10/30/2016).  Family History: Her family history includes Cancer in her son; Hypertension in her father and mother.  Social  History: She  reports that she quit smoking about 12 years ago. She has never used smokeless tobacco. She reports that she does not drink alcohol or use drugs.  Medications: Allergies as of 12/21/2017      Reactions   Adhesive [tape] Rash   Erythromycin Other (See Comments)   UNKNOWN - WAS AS A CHILD      Medication List        Accurate as of 12/21/17 10:49 AM. Always use your most recent med list.          buPROPion 150 MG 24 hr tablet Commonly known as:  WELLBUTRIN XL Take 1 tablet (150 mg total) by mouth daily.   fexofenadine 180 MG tablet Commonly known as:  ALLEGRA Take 180 mg by mouth daily as needed for allergies or rhinitis.   Melatonin 1 MG Tabs Take 1.5 mg by mouth at bedtime.   rizatriptan 10 MG tablet Commonly known as:  MAXALT Take 1 tablet (10 mg total) by mouth as needed for migraine. May repeat in 2 hours if needed

## 2018-02-15 ENCOUNTER — Encounter (HOSPITAL_BASED_OUTPATIENT_CLINIC_OR_DEPARTMENT_OTHER): Payer: No Typology Code available for payment source

## 2018-02-16 ENCOUNTER — Encounter (HOSPITAL_BASED_OUTPATIENT_CLINIC_OR_DEPARTMENT_OTHER): Payer: No Typology Code available for payment source

## 2018-03-11 ENCOUNTER — Other Ambulatory Visit: Payer: Self-pay | Admitting: Internal Medicine

## 2018-08-19 ENCOUNTER — Ambulatory Visit (INDEPENDENT_AMBULATORY_CARE_PROVIDER_SITE_OTHER): Payer: Self-pay | Admitting: Physician Assistant

## 2018-08-19 ENCOUNTER — Encounter: Payer: Self-pay | Admitting: Physician Assistant

## 2018-08-19 VITALS — BP 110/70 | HR 74 | Temp 98.0°F | Resp 16 | Ht 63.0 in | Wt 130.0 lb

## 2018-08-19 DIAGNOSIS — J029 Acute pharyngitis, unspecified: Secondary | ICD-10-CM

## 2018-08-19 DIAGNOSIS — J069 Acute upper respiratory infection, unspecified: Secondary | ICD-10-CM

## 2018-08-19 DIAGNOSIS — H6521 Chronic serous otitis media, right ear: Secondary | ICD-10-CM

## 2018-08-19 MED ORDER — PREDNISONE 20 MG PO TABS: 40.0000 mg | ORAL_TABLET | Freq: Every day | ORAL | 0 refills | Status: AC

## 2018-08-19 MED ORDER — AZELASTINE HCL 0.1 % NA SOLN: 1.0000 | Freq: Two times a day (BID) | NASAL | 0 refills | Status: DC

## 2018-08-19 MED ORDER — CETIRIZINE HCL 10 MG PO TABS: 10.0000 mg | ORAL_TABLET | Freq: Every day | ORAL | 0 refills | Status: DC

## 2018-08-19 NOTE — Progress Notes (Signed)
Patient ID: Ashley Barajas DOB: 07/12/1981 AGE: 38 y.o. MRN: [id number]   PCP: Binnie Rail, MD   Chief Complaint:  Chief Complaint  Patient presents with  . Sore Throat    x2d  . Generalized Body Aches    x2d  . Sinus pressure    x2d  . Headache    x2d     Subjective:    HPI:  Ashley Barajas is a 38 y.o. female presents for evaluation  Chief Complaint  Patient presents with  . Sore Throat    x2d  . Generalized Body Aches    x2d  . Sinus pressure    x2d  . Headache    x64d   38 year old female presents to [facility] with three day history of URI symptoms. Began with irritated/scratchy throat. Then developed nasal congestion, bilateral maxillary and frontal sinus pressure, postnasal drip. Sore throat has worsened. Severe. Aggravated with swallowing. Associated elevated temp; patient states between 58F and 100F. Has taken OTC Tylenol, ibuprofen and Sudafed with minimal symptom relief. Denies dizziness/lightheadedness, ear discharge/drainage, ear pain (patient reports chronic history of right ear popping/crackling and muffed hearing), neck pain, chest pain, SOB, wheezing, cough.   Patient with frequent episodes of sinusitis. Has previously been seen by ENT, Dr. Tami Ribas. Has been prescribed nasal spray in the past, felt did not provide any benefit, discontinued. Patient not on daily antihistamine. Former cigarette smoker. Quit 12 years ago. Patient with previous history of low platelet count, at age 38 years old. Treated with several month long course of prednisone.  Patient regularly followed by Dr. Billey Gosling MD with [facility] [place] seen 09/14/2017. Patient treated for fatigue, GAD/depression, migrianes, bilateral leg pain. Patient with history of melanoma, underwent excision on 10/30/2016. Patient with recent worsening fatigue, extensive blood work revealed no abnormality (tests for autoimmune disease, kidney function, liver tests, iron  levels, thyroid function, VitB12 level, inflammatory markers and blood counts. Patient referred to pulmonology.  Patient performed an E-Visit for 2 day history of URI symptoms on 04/10/2017. Was diagnosed with viral sinusitis. Was prescribed Flonase nasal spray. Complained that the only thing that worked for her was an antibiotic. Provider called in a wait and hold antibiotic (Augmentin), advised her not to start till day 7 of symptoms.  A limited review of symptoms was performed, pertinent positives and negatives as mentioned in HPI.  The following portions of the patient's history were reviewed and updated as appropriate: allergies, current medications and past medical history.  Patient Active Problem List   Diagnosis Date Noted  . Excessive daytime sleepiness 12/10/2017  . Memory difficulties 09/14/2017  . Pain in both lower extremities 09/14/2017  . Melanoma of skin ([place]) 10/26/2016  . Migraines 05/13/2016  . Greater trochanteric bursitis of right hip 05/07/2016  . Leg pain, bilateral 11/12/2015  . 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 Depression 04/30/2015  . Fatigue 04/30/2015  . Transient gluten sensitivity 04/30/2015  . Generalized anxiety disorder 04/30/2015    Allergies  Allergen Reactions  . Adhesive [Tape] Rash  . Erythromycin Other (See Comments)    UNKNOWN - WAS AS A CHILD    Current Outpatient Medications on File Prior to Visit  Medication Sig Dispense Refill  . buPROPion (WELLBUTRIN XL) 150 MG 24 hr tablet TAKE 1 TABLET BY MOUTH DAILY. 30 tablet 5  . fexofenadine (ALLEGRA) 180 MG tablet Take 180 mg by mouth daily as needed for allergies or rhinitis.    Marland Kitchen  rizatriptan (MAXALT) 10 MG tablet Take 1 tablet (10 mg total) by mouth as needed for migraine. May repeat in 2 hours if needed 10 tablet 8   No current facility-administered medications on file prior to visit.        Objective:   Vitals:   08/19/18 0916  BP: 110/70  Pulse: 74  Resp: 16   Temp: 98 F (36.7 C)  SpO2: 99%     Wt Readings from Last 3 Encounters:  08/19/18 130 lb (59 kg)  12/21/17 125 lb 9.6 oz (57 kg)  11/09/17 128 lb (58.1 kg)    Physical Exam:   General Appearance:  Patient sitting comfortably on examination table. Conversational. Ashley Barajas self-historian. In no acute distress. Afebrile.   Head:  Normocephalic, without obvious abnormality, atraumatic  Eyes:  PERRL, conjunctiva/corneas clear, EOM's intact  Ears:  Bilateral ear canals WNL. No erythema or edema. No discharge/drainage. Left TM WNL. No erythema or injection. Right TM with serous effusion (bubbles seen on posterior aspect of TM). No erythema or injection. No discharge.  Nose: Nares normal, septum midline. Nasal mucosa with bilateral edema. No visible discharge. Mild tenderness with palpation over bilateral maxillary sinuses.  Throat: Lips, mucosa, and tongue normal; teeth and gums normal. Throat reveals no erythema. Tonsils with minimal edema and mild erythema. No exudate. No hot potato/muffled voice. No stridor. No drooling. No trismus. No petechiae on soft palate.  Neck: Supple, symmetrical, trachea midline, no adenopathy  Lungs:   Clear to auscultation bilaterally, respirations unlabored  Heart:  Regular rate and rhythm, S1 and S2 normal, no murmur, rub, or gallop  Extremities: Extremities normal, atraumatic, no cyanosis or edema  Pulses: 2+ and symmetric  Skin: Skin color, texture, turgor normal, no rashes or lesions  Lymph nodes: Cervical, supraclavicular, and axillary nodes normal  Neurologic: Normal    Assessment & Plan:    Exam findings, diagnosis etiology and medication use and indications reviewed with patient. Follow-Up and discharge instructions provided. No emergent/urgent issues found on exam.  Patient education was provided.   Patient verbalized understanding of information provided and agrees with plan of care (POC), all questions answered. The patient is advised to call or  return to clinic if condition does not see an improvement in symptoms, or to seek the care of the closest emergency department if condition worsens with the below plan.    1. Upper respiratory tract infection, unspecified type - azelastine (ASTELIN) 0.1 % nasal spray; Place 1 spray into both nostrils 2 (two) times daily. Use in each nostril as directed  Dispense: 30 mL; Refill: 0 - cetirizine (ZYRTEC) 10 MG tablet; Take 1 tablet (10 mg total) by mouth daily.  Dispense: 14 tablet; Refill: 0  2. Right chronic serous otitis media - predniSONE (DELTASONE) 20 MG tablet; Take 2 tablets (40 mg total) by mouth daily with breakfast for 5 days.  Dispense: 10 tablet; Refill: 0  3. Sorethroat - POCT rapid strep A  Patient presents to [facility] with 3 day history of URI symptoms. Primary complaint sore throat. Negative rapid strep test. No physical exam findings suggestive of peritonsillar abscess or retropharyngeal abscess.   Patient prescribed prednisone 40mg  qd x 5 days for right serous effusion, unknown how long present. Advised patient follow-up with ENT for further evaluation and treatment.  Prescribed Astelin nasal spray and Zyrtec for symptom relief of URI symptoms. Patient's VSS, afebrile, in no acute distress, benign physical exam, clear lung sounds, short duration of symptoms. Believe patient has self-limited  viral URI. Had discussion with patient why antibiotics were not indicated at this time. Advised patient follow-up with family physician or urgent care in one week if symptoms not improving. Patient agreed with plan.   Darlin Priestly, MHS, PA-C Montey Hora, MHS, PA-C Advanced Practice Provider [facility]  [address] [facility], [place], [place] [address] (p):  [phone] [email or web address]

## 2018-08-19 NOTE — Patient Instructions (Addendum)
Thank you for choosing [facility] for your health care needs.  You have been diagnosed with an upper respiratory infection (a cold). You also have right serous otitis media / eustachian tube dysfunction.  Take medication as prescribed. Meds ordered this encounter  Medications  . predniSONE (DELTASONE) 20 MG tablet    Sig: Take 2 tablets (40 mg total) by mouth daily with breakfast for 5 days.    Dispense:  10 tablet    Refill:  0    Order Specific Question:   Supervising Provider    Answer:   Sabra Heck, BRIAN [[id number]]  . azelastine (ASTELIN) 0.1 % nasal spray    Sig: Place 1 spray into both nostrils 2 (two) times daily. Use in each nostril as directed    Dispense:  30 mL    Refill:  0    Order Specific Question:   Supervising Provider    Answer:   MILLER, BRIAN [[id number]]  . cetirizine (ZYRTEC) 10 MG tablet    Sig: Take 1 tablet (10 mg total) by mouth daily.    Dispense:  14 tablet    Refill:  0    Order Specific Question:   Supervising Provider    Answer:   Sabra Heck, BRIAN [[id number]]    Increase fluids. Rest. May use over the counter cough and cold medication for symptom relief.  Follow-up with family physician or urgent care in one week if symptoms not improving.  May wish to schedule appointment with ENT for further management of right eustachian tube dysfunction.  Hope you feel better soon!  Upper Respiratory Infection, Adult An upper respiratory infection (URI) affects the nose, throat, and upper air passages. URIs are caused by germs (viruses). The most common type of URI is often called "the common cold." Medicines cannot cure URIs, but you can do things at home to relieve your symptoms. URIs usually get better within 7-10 days. Follow these instructions at home: Activity  Rest as needed.  If you have a fever, stay home from work or school until your fever is gone, or until your doctor says you may return to work or school. ? You should stay home until you cannot spread the  infection anymore (you are not contagious). ? Your doctor may have you wear a face mask so you have less risk of spreading the infection. Relieving symptoms  Gargle with a salt-water mixture 3-4 times a day or as needed. To make a salt-water mixture, completely dissolve -1 tsp of salt in 1 cup of warm water.  Use a cool-mist humidifier to add moisture to the air. This can help you breathe more easily. Eating and drinking   Drink enough fluid to keep your pee (urine) pale yellow.  Eat soups and other clear broths. General instructions   Take over-the-counter and prescription medicines only as told by your doctor. These include cold medicines, fever reducers, and cough suppressants.  Do not use any products that contain nicotine or tobacco. These include cigarettes and e-cigarettes. If you need help quitting, ask your doctor.  Avoid being where people are smoking (avoid secondhand smoke).  Make sure you get regular shots and get the flu shot every year.  Keep all follow-up visits as told by your doctor. This is important. How to avoid spreading infection to others   Wash your hands often with soap and water. If you do not have soap and water, use hand sanitizer.  Avoid touching your mouth, face, eyes, or nose.  Cough or sneeze into  a tissue or your sleeve or elbow. Do not cough or sneeze into your hand or into the air. Contact a doctor if:  You are getting worse, not better.  You have any of these: ? A fever. ? Chills. ? Brown or red mucus in your nose. ? Yellow or brown fluid (discharge)coming from your nose. ? Pain in your face, especially when you bend forward. ? Swollen neck glands. ? Pain with swallowing. ? White areas in the back of your throat. Get help right away if:  You have shortness of breath that gets worse.  You have very bad or constant: ? Headache. ? Ear pain. ? Pain in your forehead, behind your eyes, and over your cheekbones (sinus pain). ? Chest  pain.  You have long-lasting (chronic) lung disease along with any of these: ? Wheezing. ? Long-lasting cough. ? Coughing up blood. ? A change in your usual mucus.  You have a stiff neck.  You have changes in your: ? Vision. ? Hearing. ? Thinking. ? Mood. Summary  An upper respiratory infection (URI) is caused by a germ called a virus. The most common type of URI is often called "the common cold."  URIs usually get better within 7-10 days.  Take over-the-counter and prescription medicines only as told by your doctor. This information is not intended to replace advice given to you by your health care provider. Make sure you discuss any questions you have with your health care provider. Document Released: 12/17/2007 Document Revised: 02/20/2017 Document Reviewed: 02/20/2017 Elsevier Interactive Patient Education  2019 [employer].

## 2018-08-20 ENCOUNTER — Encounter: Payer: No Typology Code available for payment source | Admitting: Internal Medicine

## 2018-08-23 ENCOUNTER — Telehealth: Payer: Self-pay | Admitting: Emergency Medicine

## 2018-08-23 NOTE — Telephone Encounter (Signed)
Left message following up on visit with [facility] 

## 2018-08-25 ENCOUNTER — Ambulatory Visit (INDEPENDENT_AMBULATORY_CARE_PROVIDER_SITE_OTHER): Payer: Self-pay | Admitting: Physician Assistant

## 2018-08-25 ENCOUNTER — Encounter: Payer: Self-pay | Admitting: Physician Assistant

## 2018-08-25 VITALS — BP 112/80 | HR 71 | Temp 98.9°F | Resp 12 | Wt 128.0 lb

## 2018-08-25 DIAGNOSIS — N3289 Other specified disorders of bladder: Secondary | ICD-10-CM

## 2018-08-25 DIAGNOSIS — J0100 Acute maxillary sinusitis, unspecified: Secondary | ICD-10-CM

## 2018-08-25 LAB — POC URINALSYSI DIPSTICK (AUTOMATED)
Bilirubin, UA: NEGATIVE
Blood, UA: NEGATIVE
Glucose, UA: NEGATIVE
KETONES UA: NEGATIVE
Leukocytes, UA: NEGATIVE
Nitrite, UA: NEGATIVE
Protein, UA: NEGATIVE
Urobilinogen, UA: 0.2 E.U./dL
pH, UA: 6.5 (ref 5.0–8.0)

## 2018-08-25 LAB — POCT URINE PREGNANCY: Preg Test, Ur: NEGATIVE

## 2018-08-25 MED ORDER — PHENAZOPYRIDINE HCL 200 MG PO TABS: 200.0000 mg | ORAL_TABLET | Freq: Three times a day (TID) | ORAL | 0 refills | Status: DC | PRN

## 2018-08-25 MED ORDER — AMOXICILLIN-POT CLAVULANATE 875-125 MG PO TABS: 1.0000 | ORAL_TABLET | Freq: Two times a day (BID) | ORAL | 0 refills | Status: AC

## 2018-08-25 MED ORDER — BENZONATATE 100 MG PO CAPS: 200.0000 mg | ORAL_CAPSULE | Freq: Every day | ORAL | 0 refills | Status: AC

## 2018-08-25 NOTE — Patient Instructions (Addendum)
Thank you for choosing [facility] for your health care needs.  You have been diagnosed with sinusitis.  Take antibiotic, Augmentin. 1 tablet twice a day x 7 days. Take with food to prevent stomach upset. May use an over the counter probiotic such as achidophilus or eat yogurt while on antibiotic, to help replace the body's good bacteria.  Increase fluids. Rest. May use nasal spray. May use NettiPot.  You have been prescribed Tessalon Perles, to help with cough at night.  Follow-up with family physician or urgent care in 4-5 days if sinus symptoms not improving, sooner with any worsening symptoms.  In regards to bladder spasms, recommend you follow-up with your family physician or Ob/Gyn asap. Your urinalysis performed in clinic today was normal. Your urine pregnancy test was negative. Prescribed Pyridium to help with symptoms.  Recommend you discontinue use of antihistamines; Zyrtec, Allegra, or Claritin. Recommend you avoid caffeine (coffee, chocolate, green tea) and carbonated beverages (soda, seltzer, etc.) can sometimes cause bladder irritation.  Hope you feel better soon!  Sinusitis, Adult Sinusitis is soreness and swelling (inflammation) of your sinuses. Sinuses are hollow spaces in the bones around your face. They are located:  Around your eyes.  In the middle of your forehead.  Behind your nose.  In your cheekbones. Your sinuses and nasal passages are lined with a fluid called mucus. Mucus drains out of your sinuses. Swelling can trap mucus in your sinuses. This lets germs (bacteria, virus, or fungus) grow, which leads to infection. Most of the time, this condition is caused by a virus. What are the causes? This condition is caused by:  Allergies.  Asthma.  Germs.  Things that block your nose or sinuses.  Growths in the nose (nasal polyps).  Chemicals or irritants in the air.  Fungus (rare). What increases the risk? You are more likely to develop this  condition if:  You have a weak body defense system (immune system).  You do a lot of swimming or diving.  You use nasal sprays too much.  You smoke. What are the signs or symptoms? The main symptoms of this condition are pain and a feeling of pressure around the sinuses. Other symptoms include:  Stuffy nose (congestion).  Runny nose (drainage).  Swelling and warmth in the sinuses.  Headache.  Toothache.  A cough that may get worse at night.  Mucus that collects in the throat or the back of the nose (postnasal drip).  Being unable to smell and taste.  Being very tired (fatigue).  A fever.  Sore throat.  Bad breath. How is this diagnosed? This condition is diagnosed based on:  Your symptoms.  Your medical history.  A physical exam.  Tests to find out if your condition is short-term (acute) or long-term (chronic). Your doctor may: ? Check your nose for growths (polyps). ? Check your sinuses using a tool that has a light (endoscope). ? Check for allergies or germs. ? Do imaging tests, such as an MRI or CT scan. How is this treated? Treatment for this condition depends on the cause and whether it is short-term or long-term.  If caused by a virus, your symptoms should go away on their own within 10 days. You may be given medicines to relieve symptoms. They include: ? Medicines that shrink swollen tissue in the nose. ? Medicines that treat allergies (antihistamines). ? A spray that treats swelling of the nostrils. ? Rinses that help get rid of thick mucus in your nose (nasal saline washes).  If caused by bacteria, your doctor may wait to see if you will get better without treatment. You may be given antibiotic medicine if you have: ? A very bad infection. ? A weak body defense system.  If caused by growths in the nose, you may need to have surgery. Follow these instructions at home: Medicines  Take, use, or apply over-the-counter and prescription medicines  only as told by your doctor. These may include nasal sprays.  If you were prescribed an antibiotic medicine, take it as told by your doctor. Do not stop taking the antibiotic even if you start to feel better. Hydrate and humidify   Drink enough water to keep your pee (urine) pale yellow.  Use a cool mist humidifier to keep the humidity level in your home above 50%.  Breathe in steam for 10-15 minutes, 3-4 times a day, or as told by your doctor. You can do this in the bathroom while a hot shower is running.  Try not to spend time in cool or dry air. Rest  Rest as much as you can.  Sleep with your head raised (elevated).  Make sure you get enough sleep each night. General instructions   Put a warm, moist washcloth on your face 3-4 times a day, or as often as told by your doctor. This will help with discomfort.  Wash your hands often with soap and water. If there is no soap and water, use hand sanitizer.  Do not smoke. Avoid being around people who are smoking (secondhand smoke).  Keep all follow-up visits as told by your doctor. This is important. Contact a doctor if:  You have a fever.  Your symptoms get worse.  Your symptoms do not get better within 10 days. Get help right away if:  You have a very bad headache.  You cannot stop throwing up (vomiting).  You have very bad pain or swelling around your face or eyes.  You have trouble seeing.  You feel confused.  Your neck is stiff.  You have trouble breathing. Summary  Sinusitis is swelling of your sinuses. Sinuses are hollow spaces in the bones around your face.  This condition is caused by tissues in your nose that become inflamed or swollen. This traps germs. These can lead to infection.  If you were prescribed an antibiotic medicine, take it as told by your doctor. Do not stop taking it even if you start to feel better.  Keep all follow-up visits as told by your doctor. This is important. This information  is not intended to replace advice given to you by your health care provider. Make sure you discuss any questions you have with your health care provider. Document Released: 12/17/2007 Document Revised: 11/30/2017 Document Reviewed: 11/30/2017 Elsevier Interactive Patient Education  2019 [employer].

## 2018-08-25 NOTE — Progress Notes (Signed)
Patient ID: Ashley Barajas DOB: 1980-08-14 AGE: 38 y.o. MRN: [id number]   PCP: Binnie Rail, MD   Chief Complaint:  Chief Complaint  Patient presents with  . sinus pressure    x1week (nasal spray/prednisone)  . bladder spasms    x1 week     Subjective:    HPI:  Ashley Barajas is a 38 y.o. female presents for evaluation  Chief Complaint  Patient presents with  . sinus pressure    x1week (nasal spray/prednisone)  . bladder spasms    x75 week    38 year old female returns to [facility] in regards to continuation of sinus symptoms.  Patient seen at [facility] on 08/19/2018, six days ago, with 3 day history of URI symptoms. Negative rapid strep test. Diagnosed with URI. Prescribed prednisone for right serous effusion (though, suspect chronic, advised f/u with ENT), Astelin nasal spray and Zyrtec.  Patient returns today, states her symptoms have not improved. Reports nasal congestion, bilateral maxillary sinus pressure/congesiton (right worse than left), postnasal drip, and coarse/junky cough. Cough persistent throughout the night, causing difficulty sleeping. States sputum is thick yellow/green. Associated chest tightness. Has been taking Allegra daily. Reports intermittent fever, 69F-100F, takes temperature several times a day. Denies chills, sweats, headache, ear pain, tinnitus, epistaxis, sore throat, chest pain, SOB, wheezing. Patient states last episode of sinusitis three years ago (E-Visit preformed on 04/10/2017 for sinusitis). Patient denies history of post-antibiotic course vaginal yeast infection.  Patient also with complaint of one week history of bladder spasms. Intermittent. Random; has not noticed any activity, time of day, position, etc that triggers episodes. Over past week has become slightly more frequent. Associated increased nocturia; 1-2 episodes per night. Denies dysuria, increased urinary frequency (states she does urinate frequently due to  drinking lots of water), urinary urgency, urinary incontinence (urge or stress), foul odor to urine, abdominal pain/discomfort. Denies vaginal rash/discharge. Denies concern for STD. Patient has an IUD. No previous history of similar symptoms. No recent change in increased caffeine or carbonated beverage intake (patient drinks two cups of coffee every morning, per her normal).  A limited review of symptoms was performed, pertinent positives and negatives as mentioned in HPI.  The following portions of the patient's history were reviewed and updated as appropriate: allergies, current medications and past medical history.  Patient Active Problem List   Diagnosis Date Noted  . Excessive daytime sleepiness 12/10/2017  . Memory difficulties 09/14/2017  . Pain in both lower extremities 09/14/2017  . Melanoma of skin ([place]) 10/26/2016  . Migraines 05/13/2016  . Greater trochanteric bursitis of right hip 05/07/2016  . Leg pain, bilateral 11/12/2015  . 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 Depression 04/30/2015  . Fatigue 04/30/2015  . Transient gluten sensitivity 04/30/2015  . Generalized anxiety disorder 04/30/2015    Allergies  Allergen Reactions  . Adhesive [Tape] Rash  . Erythromycin Other (See Comments)    UNKNOWN - WAS AS A CHILD    Current Outpatient Medications on File Prior to Visit  Medication Sig Dispense Refill  . azelastine (ASTELIN) 0.1 % nasal spray Place 1 spray into both nostrils 2 (two) times daily. Use in each nostril as directed 30 mL 0  . buPROPion (WELLBUTRIN XL) 150 MG 24 hr tablet TAKE 1 TABLET BY MOUTH DAILY. 30 tablet 5  . fexofenadine (ALLEGRA) 180 MG tablet Take 180 mg by mouth daily as needed for allergies or rhinitis.    . rizatriptan (MAXALT) 10 MG tablet Take 1  tablet (10 mg total) by mouth as needed for migraine. May repeat in 2 hours if needed 10 tablet 8   No current facility-administered medications on file prior to visit.         Objective:   Vitals:   08/25/18 1129  BP: 112/80  Pulse: 71  Resp: 12  Temp: 98.9 F (37.2 C)  SpO2: 99%     Wt Readings from Last 3 Encounters:  08/25/18 128 lb (58.1 kg)  08/19/18 130 lb (59 kg)  12/21/17 125 lb 9.6 oz (57 kg)    Physical Exam:   General Appearance:  Patient sitting comfortably on examination table. Conversational. Kermit Balo self-historian. In no acute distress. Afebrile.   Head:  Normocephalic, without obvious abnormality, atraumatic  Eyes:  PERRL, conjunctiva/corneas clear, EOM's intact  Ears:  Bilateral ear canals WNL. No erythema or edema. No discharge/drainage. Bilateral TMs WNL. No erythema or injection. Possible minimal serous effusion.  Nose: Nares normal, septum midline. Nasal mucosa with bilateral edema and scant clear rhinorrhea. Mild tenderness with palpation over bilateral maxillary and frontal sinuses.  Throat: Lips, mucosa, and tongue normal; teeth and gums normal. Throat reveals no erythema. Tonsils with no enlargement or exudate. No visible postnasal drip.  Neck: Supple, symmetrical, trachea midline, no adenopathy  Lungs:   Clear to auscultation bilaterally, respirations unlabored. Good aeration. No rales, rhonchi, crackles or wheezing. No cough elicited with deep inspiration.  Heart:  Regular rate and rhythm, S1 and S2 normal, no murmur, rub, or gallop  Abdomen:   Normal to inspection. Normoactive bowel sounds. No tenderness with palpation. No rigidity, guarding, or rebound tenderness.   Extremities: Extremities normal, atraumatic, no cyanosis or edema  Pulses: 2+ and symmetric  Skin: Skin color, texture, turgor normal, no rashes or lesions  Lymph nodes: Cervical, supraclavicular, and axillary nodes normal  Neurologic: Normal    Assessment & Plan:    Exam findings, diagnosis etiology and medication use and indications reviewed with patient. Follow-Up and discharge instructions provided. No emergent/urgent issues found on exam.  Patient  education was provided.   Patient verbalized understanding of information provided and agrees with plan of care (POC), all questions answered. The patient is advised to call or return to clinic if condition does not see an improvement in symptoms, or to seek the care of the closest emergency department if condition worsens with the below plan.    Urinalysis performed in office: normal. No blood, leukocytes, nitrites, protein, etc. <1.005 specific gravity. Negative urine pregnancy test.   1. Acute non-recurrent maxillary sinusitis - amoxicillin-clavulanate (AUGMENTIN) 875-125 MG tablet; Take 1 tablet by mouth 2 (two) times daily for 7 days.  Dispense: 14 tablet; Refill: 0 - benzonatate (TESSALON PERLES) 100 MG capsule; Take 2 capsules (200 mg total) by mouth at bedtime for 7 days.  Dispense: 14 capsule; Refill: 0  2. Bladder spasm - POCT Urinalysis Dipstick (Automated) - phenazopyridine (PYRIDIUM) 200 MG tablet; Take 1 tablet (200 mg total) by mouth 3 (three) times daily as needed for pain.  Dispense: 10 tablet; Refill: 0  Patient with 9 day history of sinus symptoms. Patient reports worsening sinus pressure, right worse than left, thickening of mucous, and subjective fevers. Due to duration and failure to improve with Zyrtec, Astelin nasal spray, and 5-day course of prednisone, will prescribe antibiotic at this time. Prescribed 7-day course of Augmentin. Prescribed Tessalon perles for cough relief at night. Advised patient still schedule appointment with ENT for management of serous effusion/ETD. Advised patient follow-up with PCP  or urgent care or ENT in one week if symptoms not improving, sooner with worsening symptoms.  In regards to bladder spasm. Urinalysis normal. Negative urine pregnancy test. Benign abdominal exam. No vaginal rash/discharge. Do not believe cause of symptoms is UTI or vaginitis. Discussed possibility of interstitial cystitis/overactive bladder. Advised decrease caffeine  and carbonated beverage intake. Advised discontinuation of antihistamine. Prescribed Pyridium for possible symptom relief. Advised patient follow-up with Ob/Gyn for further evaluation and treatment. Patient agreed with plan.    Darlin Priestly, MHS, PA-C Montey Hora, MHS, PA-C Advanced Practice Provider [facility]  [address] [facility], [place], [place] [address] (p):  [phone] [email or web address]

## 2018-09-07 NOTE — Progress Notes (Signed)
Documentation under acute visit note for Ashley Barajas [facility]) - listed as cancelled. Date of service 08/25/2018. SFS PA-C

## 2019-05-17 ENCOUNTER — Other Ambulatory Visit: Payer: Self-pay

## 2019-05-17 ENCOUNTER — Encounter: Payer: Self-pay | Admitting: Internal Medicine

## 2019-05-17 MED ORDER — BUPROPION HCL ER (XL) 150 MG PO TB24: 150.0000 mg | ORAL_TABLET | Freq: Every day | ORAL | 0 refills | Status: DC

## 2019-05-19 NOTE — Progress Notes (Signed)
Virtual Visit via Video Note  I connected with Ashley Barajas on 05/20/19 at  1:00 PM EST by a video enabled telemedicine application and verified that I am speaking with the correct person using two identifiers.   I discussed the limitations of evaluation and management by telemedicine and the availability of in person appointments. The patient expressed understanding and agreed to proceed.  Present for the visit:  Myself, Dr Billey Gosling, Richardo Hanks.  The patient is currently at home and I am in the office.    No referring provider.    History of Present Illness: She is here for follow up of her chronic medical conditions.   She is working and in school.  Her kids are also at home doing remote learning.    Depression: She is taking her medication daily as prescribed. She did stop it and restarted it three days ago.  She did not feel she needed it and recently she was feeling really down and was crying a lot.  She has increased stress.  She denies any side effects from the medication.    Migraine headaches: She has migraines about once a month, but these past couple of months she has had increased migraines.  She takes Maxalt as needed.  Medication is effective. She denies side effects.    Review of Systems  Constitutional: Negative for fever.  Respiratory: Negative for cough, shortness of breath and wheezing.   Cardiovascular: Negative for chest pain and palpitations.  Neurological: Positive for headaches.  Psychiatric/Behavioral: Positive for depression. The patient is nervous/anxious. The patient does not have insomnia.      Social History   Socioeconomic History  . Marital status: Married    Spouse name: Not on file  . Number of children: 2  . Years of education: Not on file  . Highest education level: Not on file  Occupational History  . Not on file  Social Needs  . Financial resource strain: Not on file  . Food insecurity    Worry: Not on file    Inability: Not on  file  . Transportation needs    Medical: Not on file    Non-medical: Not on file  Tobacco Use  . Smoking status: Former Smoker    Quit date: 07/13/2005    Years since quitting: 13.8  . Smokeless tobacco: Never Used  Substance and Sexual Activity  . Alcohol use: No  . Drug use: No  . Sexual activity: Not on file  Lifestyle  . Physical activity    Days per week: Not on file    Minutes per session: Not on file  . Stress: Not on file  Relationships  . Social Herbalist on phone: Not on file    Gets together: Not on file    Attends religious service: Not on file    Active member of club or organization: Not on file    Attends meetings of clubs or organizations: Not on file    Relationship status: Not on file  Other Topics Concern  . Not on file  Social History Narrative   Nurse - long term care      Two children: 6,11     Observations/Objective: Appears well in NAD Breathing normally Mood appears normal  Assessment and Plan:  See Problem List for Assessment and Plan of chronic medical problems.   Follow Up Instructions:    I discussed the assessment and treatment plan with the patient. The patient was  provided an opportunity to ask questions and all were answered. The patient agreed with the plan and demonstrated an understanding of the instructions.   The patient was advised to call back or seek an in-person evaluation if the symptoms worsen or if the condition fails to improve as anticipated.    Binnie Rail, MD

## 2019-05-20 ENCOUNTER — Other Ambulatory Visit: Payer: Self-pay

## 2019-05-20 ENCOUNTER — Ambulatory Visit (INDEPENDENT_AMBULATORY_CARE_PROVIDER_SITE_OTHER): Payer: No Typology Code available for payment source | Admitting: Internal Medicine

## 2019-05-20 ENCOUNTER — Encounter: Payer: Self-pay | Admitting: Internal Medicine

## 2019-05-20 DIAGNOSIS — G43009 Migraine without aura, not intractable, without status migrainosus: Secondary | ICD-10-CM | POA: Diagnosis not present

## 2019-05-20 DIAGNOSIS — F3289 Other specified depressive episodes: Secondary | ICD-10-CM

## 2019-05-20 MED ORDER — BUPROPION HCL ER (XL) 150 MG PO TB24: 150.0000 mg | ORAL_TABLET | Freq: Every day | ORAL | 1 refills | Status: DC

## 2019-05-20 MED ORDER — RIZATRIPTAN BENZOATE 10 MG PO TABS: 10.0000 mg | ORAL_TABLET | ORAL | 8 refills | Status: DC | PRN

## 2019-05-20 NOTE — Assessment & Plan Note (Addendum)
She stopped the medication for a while and became more depressed Also having increased anxiety She restarted Wellbutrin 150 mg daily 3 days ago and is starting to feel slightly better Stressed staying on the medication for as long as she needs it If in a few weeks she is not feeling better we can either increase the medication or add something to it-advised her sent me a note through my chart if needed

## 2019-05-20 NOTE — Assessment & Plan Note (Addendum)
Typically migraines occur once a month - has been more recently -- likely related to stress Continue maxalt

## 2019-09-13 NOTE — Progress Notes (Signed)
Subjective:    Patient ID: Ashley Barajas, female    DOB: December 23, 1980, 39 y.o.   MRN: [id number]  HPI She is here for a physical exam.   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  She does need to make sure she has immunity to MMR, varicella and hepatitis B.  She needs a TB test today.  Medications and allergies reviewed with patient and updated if appropriate.  Patient Active Problem List   Diagnosis Date Noted  . Excessive daytime sleepiness 12/10/2017  . Memory difficulties 09/14/2017  . Pain in both lower extremities 09/14/2017  . History of melanoma 10/26/2016  . Migraines 05/13/2016  . Greater trochanteric bursitis of right hip 05/07/2016  . Leg pain, bilateral 11/12/2015  . 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 Depression 04/30/2015  . Fatigue 04/30/2015  . Transient gluten sensitivity 04/30/2015  . Generalized anxiety disorder 04/30/2015    Current Outpatient Medications on File Prior to Visit  Medication Sig Dispense Refill  . azelastine (ASTELIN) 0.1 % nasal spray Place 1 spray into both nostrils 2 (two) times daily. Use in each nostril as directed 30 mL 0  . buPROPion (WELLBUTRIN XL) 150 MG 24 hr tablet Take 1 tablet (150 mg total) by mouth daily. 90 tablet 1  . fexofenadine (ALLEGRA) 180 MG tablet Take 180 mg by mouth daily as needed for allergies or rhinitis.    Marland Kitchen levonorgestrel (MIRENA, 52 MG,) 20 MCG/24HR IUD Mirena    . phenazopyridine (PYRIDIUM) 200 MG tablet Take 1 tablet (200 mg total) by mouth 3 (three) times daily as needed for pain. 10 tablet 0  . rizatriptan (MAXALT) 10 MG tablet Take 1 tablet (10 mg total) by mouth as needed for migraine. May repeat in 2 hours if needed 10 tablet 8   No current facility-administered medications on file prior to visit.    Past Medical History:  Diagnosis Date  . Amenorrhea, unspecified    states no menses in 9 yrs.  . Anemia   .  Constipation   . ITP (idiopathic thrombocytopenic purpura)   . 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 Migraines     Past Surgical History:  Procedure Laterality Date  . MELANOMA EXCISION Right 10/30/2016   Procedure: WIDE LOCAL EXCISION AND ADVANCEMENT FLAP CLOSURE DEFECT RIGHT BUTTOCK;  Surgeon: Stark Klein, MD;  Location: [facility];  Service: General;  Laterality: Right;  . NO PAST SURGERIES      Social History   Socioeconomic History  . Marital status: Married    Spouse name: Not on file  . Number of children: 2  . Years of education: Not on file  . Highest education level: Not on file  Occupational History  . Not on file  Tobacco Use  . Smoking status: Former Smoker    Quit date: 07/13/2005    Years since quitting: 14.1  . Smokeless tobacco: Never Used  Substance and Sexual Activity  . Alcohol use: No  . Drug use: No  . Sexual activity: Not on file  Other Topics Concern  . 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 Difficulty of Paying Living Expenses: Not on file  Food Insecurity:   . Worried About Running  Out of Food in the Last Year: Not on file  . Ran Out of Food in the Last Year: Not on file  Transportation Needs:   . Lack of Transportation (Medical): Not on file  . Lack of Transportation (Non-Medical): Not on file  Physical Activity:   . Days of Exercise per Week: Not on file  . Minutes of Exercise per Session: Not on file  Stress:   . Feeling of Stress : Not on file  Social Connections:   . Frequency of Communication with Friends and Family: Not on file  . Frequency of Social Gatherings with Friends and Family: Not on file  . Attends Religious Services: Not on file  . Active Member of Clubs or Organizations: Not on file  . Attends Archivist Meetings: Not on file  . Marital Status: Not on file    Family  History  Problem Relation Age of Onset  . Hypertension Mother   . Hypertension Father   . Cancer Son        leukemia    Review of Systems  Constitutional: Negative for chills, fatigue and fever.  Eyes: Negative for visual disturbance.  Respiratory: Negative for cough, shortness of breath and wheezing.   Cardiovascular: Negative for chest pain, palpitations and leg swelling.  Gastrointestinal: Negative for abdominal pain, blood in stool, constipation, diarrhea and nausea.  Genitourinary: Negative for dysuria and hematuria.  Musculoskeletal: Negative for arthralgias and back pain.  Skin: Negative for color change and rash.  Neurological: Positive for headaches (migraines occ). Negative for dizziness and light-headedness.  Psychiatric/Behavioral: Negative for dysphoric mood. The patient is not nervous/anxious.        Objective:   Vitals:   09/14/19 0933  BP: 116/74  Pulse: 66  Temp: 98.3 F (36.8 C)  SpO2: 97%   Filed Weights   09/14/19 0933  Weight: 129 lb 6.4 oz (58.7 kg)   Body mass index is 22.92 kg/m.  BP Readings from Last 3 Encounters:  09/14/19 116/74  08/25/18 112/80  08/19/18 110/70    Wt Readings from Last 3 Encounters:  09/14/19 129 lb 6.4 oz (58.7 kg)  08/25/18 128 lb (58.1 kg)  08/19/18 130 lb (59 kg)     Physical Exam Constitutional: She appears well-developed and well-nourished. No distress.  HENT:  Head: Normocephalic and atraumatic.  Right Ear: External ear normal. Normal ear canal and TM Left Ear: External ear normal.  Normal ear canal and TM Mouth/Throat: Oropharynx is clear and moist.  Eyes: Conjunctivae and EOM are normal.  Neck: Neck supple. No tracheal deviation present. No thyromegaly present.  No carotid bruit  Cardiovascular: Normal rate, regular rhythm and normal heart sounds.   No murmur heard.  No edema. Pulmonary/Chest: Effort normal and breath sounds normal. No respiratory distress. She has no wheezes. She has no rales.    Breast: deferred   Abdominal: Soft. She exhibits no distension. There is no tenderness.  Lymphadenopathy: She has no cervical adenopathy.  Skin: Skin is warm and dry. She is not diaphoretic.  Psychiatric: She has a normal mood and affect. Her behavior is normal.        Assessment & Plan:   Physical exam: Screening blood work    ordered Immunizations  Up to date  Gyn   Up to date  Exercise  Regular - 4 + times a week for an hour Weight  Normal  Substance abuse  none    See Problem List for Assessment and Plan of chronic  medical problems.    This visit occurred during the SARS-CoV-2 public health emergency.  Safety protocols were in place, including screening questions prior to the visit, additional usage of staff PPE, and extensive cleaning of exam room while observing appropriate contact time as indicated for disinfecting solutions.

## 2019-09-13 NOTE — Patient Instructions (Addendum)
Blood work was ordered.    All other Health Maintenance issues reviewed.   All recommended immunizations and age-appropriate screenings are up-to-date or discussed.  No immunization administered today.   Medications reviewed and updated.  Changes include :   none  Your prescription(s) have been submitted to your pharmacy. Please take as directed and contact our office if you believe you are having problem(s) with the medication(s).    Please followup in 1 year    Health Maintenance, Female Adopting a healthy lifestyle and getting preventive care are important in promoting health and wellness. Ask your health care provider about:  The right schedule for you to have regular tests and exams.  Things you can do on your own to prevent diseases and keep yourself healthy. What should I know about diet, weight, and exercise? Eat a healthy diet   Eat a diet that includes plenty of vegetables, fruits, low-fat dairy products, and lean protein.  Do not eat a lot of foods that are high in solid fats, added sugars, or sodium. Maintain a healthy weight Body mass index (BMI) is used to identify weight problems. It estimates body fat based on height and weight. Your health care provider can help determine your BMI and help you achieve or maintain a healthy weight. Get regular exercise Get regular exercise. This is one of the most important things you can do for your health. Most adults should:  Exercise for at least 150 minutes each week. The exercise should increase your heart rate and make you sweat (moderate-intensity exercise).  Do strengthening exercises at least twice a week. This is in addition to the moderate-intensity exercise.  Spend less time sitting. Even light physical activity can be beneficial. Watch cholesterol and blood lipids Have your blood tested for lipids and cholesterol at 37 years of age, then have this test every 5 years. Have your cholesterol levels checked more  often if:  Your lipid or cholesterol levels are high.  You are older than 37 years of age.  You are at high risk for heart disease. What should I know about cancer screening? Depending on your health history and family history, you may need to have cancer screening at various ages. This may include screening for:  Breast cancer.  Cervical cancer.  Colorectal cancer.  Skin cancer.  Lung cancer. What should I know about heart disease, diabetes, and high blood pressure? Blood pressure and heart disease  High blood pressure causes heart disease and increases the risk of stroke. This is more likely to develop in people who have high blood pressure readings, are of African descent, or are overweight.  Have your blood pressure checked: ? Every 3-5 years if you are 18-39 years of age. ? Every year if you are 40 years old or older. Diabetes Have regular diabetes screenings. This checks your fasting blood sugar level. Have the screening done:  Once every three years after age 40 if you are at a normal weight and have a low risk for diabetes.  More often and at a younger age if you are overweight or have a high risk for diabetes. What should I know about preventing infection? Hepatitis B If you have a higher risk for hepatitis B, you should be screened for this virus. Talk with your health care provider to find out if you are at risk for hepatitis B infection. Hepatitis C Testing is recommended for:  Everyone born from 1945 through 1965.  Anyone with known risk factors for hepatitis   C. Sexually transmitted infections (STIs)  Get screened for STIs, including gonorrhea and chlamydia, if: ? You are sexually active and are younger than 37 years of age. ? You are older than 37 years of age and your health care provider tells you that you are at risk for this type of infection. ? Your sexual activity has changed since you were last screened, and you are at increased risk for chlamydia  or gonorrhea. Ask your health care provider if you are at risk.  Ask your health care provider about whether you are at high risk for HIV. Your health care provider may recommend a prescription medicine to help prevent HIV infection. If you choose to take medicine to prevent HIV, you should first get tested for HIV. You should then be tested every 3 months for as long as you are taking the medicine. Pregnancy  If you are about to stop having your period (premenopausal) and you may become pregnant, seek counseling before you get pregnant.  Take 400 to 800 micrograms (mcg) of folic acid every day if you become pregnant.  Ask for birth control (contraception) if you want to prevent pregnancy. Osteoporosis and menopause Osteoporosis is a disease in which the bones lose minerals and strength with aging. This can result in bone fractures. If you are 65 years old or older, or if you are at risk for osteoporosis and fractures, ask your health care provider if you should:  Be screened for bone loss.  Take a calcium or vitamin D supplement to lower your risk of fractures.  Be given hormone replacement therapy (HRT) to treat symptoms of menopause. Follow these instructions at home: Lifestyle  Do not use any products that contain nicotine or tobacco, such as cigarettes, e-cigarettes, and chewing tobacco. If you need help quitting, ask your health care provider.  Do not use street drugs.  Do not share needles.  Ask your health care provider for help if you need support or information about quitting drugs. Alcohol use  Do not drink alcohol if: ? Your health care provider tells you not to drink. ? You are pregnant, may be pregnant, or are planning to become pregnant.  If you drink alcohol: ? Limit how much you use to 0-1 drink a day. ? Limit intake if you are breastfeeding.  Be aware of how much alcohol is in your drink. In the [place] one drink equals one 12 oz bottle of beer (355 mL), one 5 oz  glass of wine (148 mL), or one 1 oz glass of hard liquor (44 mL). General instructions  Schedule regular health, dental, and eye exams.  Stay current with your vaccines.  Tell your health care provider if: ? You often feel depressed. ? You have ever been abused or do not feel safe at home. Summary  Adopting a healthy lifestyle and getting preventive care are important in promoting health and wellness.  Follow your health care provider's instructions about healthy diet, exercising, and getting tested or screened for diseases.  Follow your health care provider's instructions on monitoring your cholesterol and blood pressure. This information is not intended to replace advice given to you by your health care provider. Make sure you discuss any questions you have with your health care provider. Document Revised: 06/23/2018 Document Reviewed: 06/23/2018 Elsevier Patient Education  2020 Elsevier Inc.  

## 2019-09-14 ENCOUNTER — Encounter: Payer: Self-pay | Admitting: Internal Medicine

## 2019-09-14 ENCOUNTER — Ambulatory Visit (INDEPENDENT_AMBULATORY_CARE_PROVIDER_SITE_OTHER): Payer: No Typology Code available for payment source | Admitting: Internal Medicine

## 2019-09-14 ENCOUNTER — Other Ambulatory Visit: Payer: Self-pay

## 2019-09-14 VITALS — BP 116/74 | HR 66 | Temp 98.3°F | Ht 63.0 in | Wt 129.4 lb

## 2019-09-14 DIAGNOSIS — Z111 Encounter for screening for respiratory tuberculosis: Secondary | ICD-10-CM | POA: Diagnosis not present

## 2019-09-14 DIAGNOSIS — F3289 Other specified depressive episodes: Secondary | ICD-10-CM

## 2019-09-14 DIAGNOSIS — G43009 Migraine without aura, not intractable, without status migrainosus: Secondary | ICD-10-CM

## 2019-09-14 DIAGNOSIS — Z0000 Encounter for general adult medical examination without abnormal findings: Secondary | ICD-10-CM | POA: Diagnosis not present

## 2019-09-14 DIAGNOSIS — F411 Generalized anxiety disorder: Secondary | ICD-10-CM

## 2019-09-14 DIAGNOSIS — Z85820 Personal history of malignant melanoma of skin: Secondary | ICD-10-CM | POA: Diagnosis not present

## 2019-09-14 DIAGNOSIS — Z0184 Encounter for antibody response examination: Secondary | ICD-10-CM

## 2019-09-14 LAB — CBC WITH DIFFERENTIAL/PLATELET
Basophils Absolute: 0 10*3/uL (ref 0.0–0.1)
Basophils Relative: 0.9 % (ref 0.0–3.0)
Eosinophils Absolute: 0.1 10*3/uL (ref 0.0–0.7)
Eosinophils Relative: 1.3 % (ref 0.0–5.0)
HCT: 39.3 % (ref 36.0–46.0)
Hemoglobin: 13.3 g/dL (ref 12.0–15.0)
Lymphocytes Relative: 29.6 % (ref 12.0–46.0)
Lymphs Abs: 1.6 10*3/uL (ref 0.7–4.0)
MCHC: 33.8 g/dL (ref 30.0–36.0)
MCV: 88.6 fl (ref 78.0–100.0)
Monocytes Absolute: 0.4 10*3/uL (ref 0.1–1.0)
Monocytes Relative: 6.8 % (ref 3.0–12.0)
Neutro Abs: 3.3 10*3/uL (ref 1.4–7.7)
Neutrophils Relative %: 61.4 % (ref 43.0–77.0)
Platelets: 158 10*3/uL (ref 150.0–400.0)
RBC: 4.43 Mil/uL (ref 3.87–5.11)
RDW: 13.4 % (ref 11.5–15.5)
WBC: 5.3 10*3/uL (ref 4.0–10.5)

## 2019-09-14 LAB — LIPID PANEL
Cholesterol: 162 mg/dL (ref 0–200)
HDL: 56.9 mg/dL (ref >39.00)
LDL Cholesterol: 97 mg/dL (ref 0–99)
NonHDL: 105.35
Total CHOL/HDL Ratio: 3
Triglycerides: 43 mg/dL (ref 0.0–149.0)
VLDL: 8.6 mg/dL (ref 0.0–40.0)

## 2019-09-14 LAB — COMPREHENSIVE METABOLIC PANEL
ALT: 15 U/L (ref 0–35)
AST: 18 U/L (ref 0–37)
Albumin: 4.3 g/dL (ref 3.5–5.2)
Alkaline Phosphatase: 42 U/L (ref 39–117)
BUN: 22 mg/dL (ref 6–23)
CO2: 29 mEq/L (ref 19–32)
Calcium: 9.9 mg/dL (ref 8.4–10.5)
Chloride: 101 mEq/L (ref 96–112)
Creatinine, Ser: 0.85 mg/dL (ref 0.40–1.20)
GFR: 75.38 mL/min (ref >60.00)
Glucose, Bld: 87 mg/dL (ref 70–99)
Potassium: 4.1 mEq/L (ref 3.5–5.1)
Sodium: 137 mEq/L (ref 135–145)
Total Bilirubin: 0.4 mg/dL (ref 0.2–1.2)
Total Protein: 7.5 g/dL (ref 6.0–8.3)

## 2019-09-14 LAB — TSH: TSH: 0.86 u[IU]/mL (ref 0.35–4.50)

## 2019-09-14 NOTE — Assessment & Plan Note (Signed)
Chronic Occasional migraines Maxalt is effective Continue

## 2019-09-14 NOTE — Assessment & Plan Note (Signed)
Chronic Controlled, stable Continue current dose of medication-Wellbutrin 150 mg daily

## 2019-09-14 NOTE — Assessment & Plan Note (Signed)
Sees dermatology regularly-visits up-to-date

## 2019-09-15 ENCOUNTER — Encounter: Payer: Self-pay | Admitting: Internal Medicine

## 2019-09-15 DIAGNOSIS — Z111 Encounter for screening for respiratory tuberculosis: Secondary | ICD-10-CM

## 2019-09-15 LAB — MEASLES/MUMPS/RUBELLA IMMUNITY
Mumps IgG: 11.4 AU/mL
Rubella: 1.95 Index
Rubeola IgG: 45.9 AU/mL

## 2019-09-15 LAB — HEPATITIS B SURFACE ANTIGEN: Hepatitis B Surface Ag: NONREACTIVE

## 2019-09-15 LAB — VARICELLA ZOSTER ANTIBODY, IGG: Varicella IgG: 716.4 index

## 2019-09-15 LAB — HEPATITIS B SURFACE ANTIBODY,QUALITATIVE: Hep B S Ab: NONREACTIVE

## 2019-09-19 ENCOUNTER — Encounter: Payer: Self-pay | Admitting: Internal Medicine

## 2019-09-19 ENCOUNTER — Ambulatory Visit (INDEPENDENT_AMBULATORY_CARE_PROVIDER_SITE_OTHER): Payer: No Typology Code available for payment source | Admitting: *Deleted

## 2019-09-19 ENCOUNTER — Other Ambulatory Visit: Payer: No Typology Code available for payment source

## 2019-09-19 ENCOUNTER — Ambulatory Visit: Payer: No Typology Code available for payment source

## 2019-09-19 ENCOUNTER — Other Ambulatory Visit: Payer: Self-pay

## 2019-09-19 DIAGNOSIS — Z23 Encounter for immunization: Secondary | ICD-10-CM

## 2019-09-19 DIAGNOSIS — Z111 Encounter for screening for respiratory tuberculosis: Secondary | ICD-10-CM

## 2019-09-19 NOTE — Addendum Note (Signed)
Addended by: Trenda Moots on: [id number] 12:10 PM   Modules accepted: Orders

## 2019-09-21 ENCOUNTER — Encounter: Payer: Self-pay | Admitting: Internal Medicine

## 2019-09-21 LAB — QUANTIFERON-TB GOLD PLUS
Mitogen-NIL: 8.93 IU/mL
NIL: 0.05 IU/mL
QuantiFERON-TB Gold Plus: NEGATIVE
TB1-NIL: <0 IU/mL
TB2-NIL: <0 IU/mL

## 2019-10-17 ENCOUNTER — Other Ambulatory Visit: Payer: Self-pay

## 2019-10-17 ENCOUNTER — Ambulatory Visit (INDEPENDENT_AMBULATORY_CARE_PROVIDER_SITE_OTHER): Payer: No Typology Code available for payment source

## 2019-10-17 DIAGNOSIS — Z23 Encounter for immunization: Secondary | ICD-10-CM

## 2019-10-17 NOTE — Progress Notes (Signed)
Hep B Injection given.   Binnie Rail, MD

## 2020-04-30 NOTE — Progress Notes (Signed)
Subjective:    Patient ID: Ashley Barajas, female    DOB: [birth year], 39 y.o.   MRN: [id number]  HPI The patient is here for an acute visit for fatigue. .   She is extremely exhausted every day.  She is usually tired, but she is ok from the time she wakes up until 1 pm most days - not great but ok.  Then it feels like something hits her and she is exhausted - this typically happens after lunch.  She can not focus, she gets so sleepy.  If she is off of work she has to lay down and take a nap for 20 minutes.  She does not feel refreshed after the nap.     She gets at least 7 hrs at night and sleeps like a rock.  She still feels tired when she wakes up.     She had covid in May and still can not taste and smell anything.  On occasion she will get a whiff of something but it smells like sulfa not what it is.    Her fatigue may have gotten worse since covid.   She has occ chest tightness.  Two occasions of chest pain after working out.  Last episode 6 weeks ago. Pain started left chest to back and down left arm.   Last 5 minutes.  Painful enough she could not take a deep breath in.    Exercises 4 days a week.  She some weights and HIIT cardio.  She denies any difficulty doing her exercise.    She struggles to get through her work day.    Medications and allergies reviewed with patient and updated if appropriate.  Patient Active Problem List   Diagnosis Date Noted  . Memory difficulties 09/14/2017  . History of melanoma 10/26/2016  . Migraines 05/13/2016  . Greater trochanteric bursitis of right hip 05/07/2016  . 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 Depression 04/30/2015  . Transient gluten sensitivity 04/30/2015  . Generalized anxiety disorder 04/30/2015    Current Outpatient Medications on File Prior to Visit  Medication Sig Dispense Refill  . azelastine (ASTELIN) 0.1 % nasal spray Place 1 spray into both nostrils 2 (two) times daily. Use in each nostril as  directed 30 mL 0  . buPROPion (WELLBUTRIN XL) 150 MG 24 hr tablet Take 1 tablet (150 mg total) by mouth daily. 90 tablet 1  . fexofenadine (ALLEGRA) 180 MG tablet Take 180 mg by mouth daily as needed for allergies or rhinitis.    Marland Kitchen levonorgestrel (MIRENA, 52 MG,) 20 MCG/24HR IUD Mirena    . rizatriptan (MAXALT) 10 MG tablet Take 1 tablet (10 mg total) by mouth as needed for migraine. May repeat in 2 hours if needed 10 tablet 8   No current facility-administered medications on file prior to visit.    Past Medical History:  Diagnosis Date  . Amenorrhea, unspecified    states no menses in 9 yrs.  . Anemia   . Constipation   . ITP (idiopathic thrombocytopenic purpura)   . 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 Migraines     Past Surgical History:  Procedure Laterality Date  . MELANOMA EXCISION Right 10/30/2016   Procedure: WIDE LOCAL EXCISION AND ADVANCEMENT FLAP CLOSURE DEFECT RIGHT BUTTOCK;  Surgeon: Stark Klein, MD;  Location: [facility];  Service: General;  Laterality: Right;  . NO PAST SURGERIES      Social History   Socioeconomic  History  . Marital status: Married    Spouse name: Not on file  . Number of children: 2  . Years of education: Not on file  . Highest education level: Not on file  Occupational History  . Not on file  Tobacco Use  . Smoking status: Former Smoker    Quit date: 07/13/2005    Years since quitting: 14.8  . Smokeless tobacco: Never Used  Vaping Use  . Vaping Use: Never used  Substance and Sexual Activity  . Alcohol use: No  . Drug use: No  . Sexual activity: Not on file  Other Topics Concern  . 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 Difficulty of Paying Living Expenses: Not on file  Food Insecurity:   . Worried About Charity fundraiser in the Last Year: Not on file  . Ran Out of Food in the Last  Year: Not on file  Transportation Needs:   . Lack of Transportation (Medical): Not on file  . Lack of Transportation (Non-Medical): Not on file  Physical Activity:   . Days of Exercise per Week: Not on file  . Minutes of Exercise per Session: Not on file  Stress:   . Feeling of Stress : Not on file  Social Connections:   . Frequency of Communication with Friends and Family: Not on file  . Frequency of Social Gatherings with Friends and Family: Not on file  . Attends Religious Services: Not on file  . Active Member of Clubs or Organizations: Not on file  . Attends Archivist Meetings: Not on file  . Marital Status: Not on file    Family History  Problem Relation Age of Onset  . Hypertension Mother   . Hypertension Father   . Cancer Son        leukemia    Review of Systems  Constitutional: Positive for fatigue. Negative for chills and fever.  Respiratory: Positive for chest tightness (sometimes when breaths in). Negative for cough, shortness of breath and wheezing.   Cardiovascular: Positive for chest pain (a couple of episodes). Negative for palpitations and leg swelling.  Gastrointestinal: Negative for abdominal pain, blood in stool, constipation, diarrhea and nausea.  Genitourinary: Negative for dysuria and hematuria.  Musculoskeletal: Negative for arthralgias and back pain.       Right leg/glut pain  Skin: Negative for rash.  Neurological: Positive for light-headedness (occ getting up quick) and headaches (occ).  Psychiatric/Behavioral: Negative for dysphoric mood. The patient is nervous/anxious.        Objective:   Vitals:   05/01/20 1353  BP: 112/70  Pulse: 78  Temp: 98.2 F (36.8 C)  SpO2: 98%   BP Readings from Last 3 Encounters:  05/01/20 112/70  09/14/19 116/74  08/25/18 112/80   Wt Readings from Last 3 Encounters:  05/01/20 128 lb (58.1 kg)  09/14/19 129 lb 6.4 oz (58.7 kg)  08/25/18 128 lb (58.1 kg)   Body mass index is 22.67 kg/m.    Physical Exam    Constitutional: Appears well-developed and well-nourished. No distress.  Head: Normocephalic and atraumatic.  Neck: Neck supple. No tracheal deviation present. No thyromegaly present.  No cervical lymphadenopathy Cardiovascular: Normal rate, regular rhythm and normal heart sounds.  No murmur heard. No carotid bruit .  No edema Pulmonary/Chest: Effort normal and breath  sounds normal. No respiratory distress. No has no wheezes. No rales.  Abdomen: soft, nt, nd Skin: Skin is warm and dry. Not diaphoretic.  Psychiatric: Normal mood and affect. Behavior is normal.       Assessment & Plan:    See Problem List for Assessment and Plan of chronic medical problems.    This visit occurred during the SARS-CoV-2 public health emergency.  Safety protocols were in place, including screening questions prior to the visit, additional usage of staff PPE, and extensive cleaning of exam room while observing appropriate contact time as indicated for disinfecting solutions.

## 2020-05-01 ENCOUNTER — Ambulatory Visit (INDEPENDENT_AMBULATORY_CARE_PROVIDER_SITE_OTHER): Payer: No Typology Code available for payment source | Admitting: Internal Medicine

## 2020-05-01 ENCOUNTER — Encounter: Payer: Self-pay | Admitting: Internal Medicine

## 2020-05-01 ENCOUNTER — Other Ambulatory Visit: Payer: Self-pay

## 2020-05-01 VITALS — BP 112/70 | HR 78 | Temp 98.2°F | Ht 63.0 in | Wt 128.0 lb

## 2020-05-01 DIAGNOSIS — M79604 Pain in right leg: Secondary | ICD-10-CM

## 2020-05-01 DIAGNOSIS — R5382 Chronic fatigue, unspecified: Secondary | ICD-10-CM | POA: Diagnosis not present

## 2020-05-01 LAB — COMPREHENSIVE METABOLIC PANEL
ALT: 19 U/L (ref 0–35)
AST: 24 U/L (ref 0–37)
Albumin: 4.4 g/dL (ref 3.5–5.2)
Alkaline Phosphatase: 39 U/L (ref 39–117)
BUN: 24 mg/dL — ABNORMAL HIGH (ref 6–23)
CO2: 30 mEq/L (ref 19–32)
Calcium: 9.6 mg/dL (ref 8.4–10.5)
Chloride: 100 mEq/L (ref 96–112)
Creatinine, Ser: 0.81 mg/dL (ref 0.40–1.20)
GFR: 92.56 mL/min (ref >60.00)
Glucose, Bld: 85 mg/dL (ref 70–99)
Potassium: 3.8 mEq/L (ref 3.5–5.1)
Sodium: 136 mEq/L (ref 135–145)
Total Bilirubin: 0.4 mg/dL (ref 0.2–1.2)
Total Protein: 7.5 g/dL (ref 6.0–8.3)

## 2020-05-01 LAB — CBC WITH DIFFERENTIAL/PLATELET
Basophils Absolute: 0 10*3/uL (ref 0.0–0.1)
Basophils Relative: 0.6 % (ref 0.0–3.0)
Eosinophils Absolute: 0.2 10*3/uL (ref 0.0–0.7)
Eosinophils Relative: 2.8 % (ref 0.0–5.0)
HCT: 36.2 % (ref 36.0–46.0)
Hemoglobin: 12.4 g/dL (ref 12.0–15.0)
Lymphocytes Relative: 34.8 % (ref 12.0–46.0)
Lymphs Abs: 2 10*3/uL (ref 0.7–4.0)
MCHC: 34.3 g/dL (ref 30.0–36.0)
MCV: 88 fl (ref 78.0–100.0)
Monocytes Absolute: 0.3 10*3/uL (ref 0.1–1.0)
Monocytes Relative: 6.1 % (ref 3.0–12.0)
Neutro Abs: 3.2 10*3/uL (ref 1.4–7.7)
Neutrophils Relative %: 55.7 % (ref 43.0–77.0)
Platelets: 168 10*3/uL (ref 150.0–400.0)
RBC: 4.11 Mil/uL (ref 3.87–5.11)
RDW: 12.8 % (ref 11.5–15.5)
WBC: 5.7 10*3/uL (ref 4.0–10.5)

## 2020-05-01 LAB — T4, FREE: Free T4: 0.64 ng/dL (ref 0.60–1.60)

## 2020-05-01 LAB — TSH: TSH: 0.87 u[IU]/mL (ref 0.35–4.50)

## 2020-05-01 NOTE — Assessment & Plan Note (Signed)
Acute on chronic Especially fatigued in afternoon Working full time as a Marine scientist, exercises 4 days a week - those activities she can do but it is a struggle Some fogginess and difficulty concentrating in afternoon - not improved with nap Blood work in past has been normal. Sleep study neg for OSA No obvious cause - covid may have made this worse, but she did have it prior Will check cbc, cmp, tsh, ft4 Can try starting vitamin d 1000 units daily to see if that helps Denies depression, anxiety controlled Can try starting wellbutrin ( usually takes in winter only for SAD)  to see if that helps with focus and energy

## 2020-05-01 NOTE — Patient Instructions (Addendum)
  Blood work was ordered.     Try vitamin 1000 units daily.  Continue the multivitamin.   Consider starting the bupropion to see if that helps.  We can increase the dose.

## 2020-05-11 ENCOUNTER — Other Ambulatory Visit: Payer: Self-pay | Admitting: Family Medicine

## 2020-05-11 ENCOUNTER — Encounter: Payer: Self-pay | Admitting: Family Medicine

## 2020-05-11 ENCOUNTER — Ambulatory Visit: Payer: No Typology Code available for payment source | Admitting: Family Medicine

## 2020-05-11 ENCOUNTER — Other Ambulatory Visit: Payer: Self-pay

## 2020-05-11 ENCOUNTER — Ambulatory Visit (INDEPENDENT_AMBULATORY_CARE_PROVIDER_SITE_OTHER): Payer: No Typology Code available for payment source

## 2020-05-11 ENCOUNTER — Ambulatory Visit: Payer: Self-pay

## 2020-05-11 VITALS — BP 120/80 | HR 69 | Ht 63.0 in | Wt 126.0 lb

## 2020-05-11 DIAGNOSIS — S76301A Unspecified injury of muscle, fascia and tendon of the posterior muscle group at thigh level, right thigh, initial encounter: Secondary | ICD-10-CM

## 2020-05-11 DIAGNOSIS — M25551 Pain in right hip: Secondary | ICD-10-CM | POA: Diagnosis not present

## 2020-05-11 DIAGNOSIS — Z85820 Personal history of malignant melanoma of skin: Secondary | ICD-10-CM

## 2020-05-11 DIAGNOSIS — G8929 Other chronic pain: Secondary | ICD-10-CM

## 2020-05-11 MED ORDER — MELOXICAM 15 MG PO TABS: 15.0000 mg | ORAL_TABLET | Freq: Every day | ORAL | 0 refills | Status: DC

## 2020-05-11 NOTE — Assessment & Plan Note (Addendum)
Patient does have more of a right hamstring injury.  Discussed icing regimen and home exercise, which activities to do which wants to avoid.  Patient did get a thigh compression sleeve, askling exercises encouraged.  Meloxicam given warned of potential side effects.  Worsening pain will consider formal physical therapy.  X-rays of the pelvis and the back ordered today with history of melanoma in the right buttocks but I think this will be highly unlikely and nothing on ultrasound was found.

## 2020-05-11 NOTE — Patient Instructions (Addendum)
Good to see you xrays today Hamstring exercises Thigh compression sleeve Meloxicam for 10 days then as needed Shorter stride length Consider heel lift in shoes with long walking Drop weight 50% and increase 10% a week See me again in 5-6 weeks

## 2020-05-11 NOTE — Progress Notes (Signed)
[facility] [address] [place] Phone: [phone] Subjective:   I Kandace Blitz am serving as a Education administrator for Dr. Hulan Saas.  This visit occurred during the SARS-CoV-2 public health emergency.  Safety protocols were in place, including screening questions prior to the visit, additional usage of staff PPE, and extensive cleaning of exam room while observing appropriate contact time as indicated for disinfecting solutions.   I'm seeing this patient by the request  of:  Binnie Rail, MD  CC: Right leg and hip pain  IOX:[id number]   05/07/2016 Ashley Barajas is a 39 y.o. female coming in with complaint of hip and leg pain  Patient states that this pain seems to be mostly of her right hip as well as her legs and feet. Seems to be worse in the first things in the morning. Denies any lower back pain with it. Does not remember any true injury. Past medical history is significant for an idiopathic thrombocytopenic purpura Patient states the most the pain seems to be relocated on the right hip. States that it seems to be on the lateral aspect. Worsens first thing in the morning or after sitting for long amount of time. Seems to get somewhat better with activity. Does not know if it is associated. Sometimes feels that she has cramping in her calves bilaterally. This is very intermittent. Would state that it is less than 2 times a week. Has not notice any specific activities that seem to make it worse.  05/11/2020 Ashley Barajas is a 39 y.o. female coming in with complaint of right leg pain. Patient states she has pain on the lateral hip. Pain radiates to the lateral and posterior knee. The glut is painful as well. 6/10 at its worse. Hip pops when she walks. Lateral pain with standing and walking. Glut pain may be coming from hamstring tightness.  Patient states that any type of squats or any type of deadlifts seems to cause more pain.  Even when she walks  she says she has difficulty with tightness at the end of a walk.      Past Medical History:  Diagnosis Date  . Amenorrhea, unspecified    states no menses in 9 yrs.  . Anemia   . Constipation   . ITP (idiopathic thrombocytopenic purpura)   . Melanoma of buttock ([facility]) 10/2016   right  . Migraines    Past Surgical History:  Procedure Laterality Date  . MELANOMA EXCISION Right 10/30/2016   Procedure: WIDE LOCAL EXCISION AND ADVANCEMENT FLAP CLOSURE DEFECT RIGHT BUTTOCK;  Surgeon: Stark Klein, MD;  Location: [facility];  Service: General;  Laterality: Right;  . NO PAST SURGERIES     Social History   Socioeconomic History  . Marital status: Married    Spouse name: Not on file  . Number of children: 2  . Years of education: Not on file  . Highest education level: Not on file  Occupational History  . Not on file  Tobacco Use  . Smoking status: Former Smoker    Quit date: 07/13/2005    Years since quitting: 14.8  . Smokeless tobacco: Never Used  Vaping Use  . Vaping Use: Never used  Substance and Sexual Activity  . Alcohol use: No  . Drug use: No  . Sexual activity: Not on file  Other Topics Concern  . 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 Difficulty of Paying Living Expenses: Not on file  Food Insecurity:   . Worried About Charity fundraiser in the Last Year: Not on file  . Ran Out of Food in the Last Year: Not on file  Transportation Needs:   . Lack of Transportation (Medical): Not on file  . Lack of Transportation (Non-Medical): Not on file  Physical Activity:   . Days of Exercise per Week: Not on file  . Minutes of Exercise per Session: Not on file  Stress:   . Feeling of Stress : Not on file  Social Connections:   . Frequency of Communication with Friends and Family: Not on file  . Frequency of Social Gatherings with Friends  and Family: Not on file  . Attends Religious Services: Not on file  . Active Member of Clubs or Organizations: Not on file  . Attends Archivist Meetings: Not on file  . Marital Status: Not on file   Allergies  Allergen Reactions  . Adhesive [Tape] Rash  . Erythromycin Other (See Comments)    UNKNOWN - WAS AS A CHILD   Family History  Problem Relation Age of Onset  . Hypertension Mother   . Hypertension Father   . Cancer Son        leukemia    Current Outpatient Medications (Endocrine & Metabolic):  .  levonorgestrel (MIRENA, 52 MG,) 20 MCG/24HR IUD, Mirena   Current Outpatient Medications (Respiratory):  .  azelastine (ASTELIN) 0.1 % nasal spray, Place 1 spray into both nostrils 2 (two) times daily. Use in each nostril as directed .  fexofenadine (ALLEGRA) 180 MG tablet, Take 180 mg by mouth daily as needed for allergies or rhinitis.  Current Outpatient Medications (Analgesics):  .  rizatriptan (MAXALT) 10 MG tablet, Take 1 tablet (10 mg total) by mouth as needed for migraine. May repeat in 2 hours if needed .  meloxicam (MOBIC) 15 MG tablet, Take 1 tablet (15 mg total) by mouth daily.   Current Outpatient Medications (Other):  Marland Kitchen  buPROPion (WELLBUTRIN XL) 150 MG 24 hr tablet, Take 1 tablet (150 mg total) by mouth daily.   Reviewed prior external information including notes and imaging from  primary care provider As well as notes that were available from care everywhere and other healthcare systems.  Past medical history, social, surgical and family history all reviewed in electronic medical record.  No pertanent information unless stated regarding to the chief complaint.   Review of Systems:  No headache, visual changes, nausea, vomiting, diarrhea, constipation, dizziness, abdominal pain, skin rash, fevers, chills, night sweats, weight loss, swollen lymph nodes, body aches, joint swelling, chest pain, shortness of breath, mood changes. POSITIVE muscle  aches  Objective  Blood pressure 120/80, pulse 69, height 5\' 3"  (1.6 m), weight 126 lb (57.2 kg), SpO2 98 %.   General: No apparent distress alert and oriented x3 mood and affect normal, dressed appropriately.  HEENT: Pupils equal, extraocular movements intact  Respiratory: Patient's speak in full sentences and does not appear short of breath  Cardiovascular: No lower extremity edema, non tender, no erythema  Neuro: Cranial nerves II through XII are intact, neurovascularly intact in all extremities with 2+ DTRs and 2+ pulses.  Gait normal with good balance and coordination.  MSK: Right hip exam shows very mild tenderness over the greater trochanteric area.  Significant tightness of the hamstring.  Severe tenderness  to palpation in the ischial tuberosity area.  Patient does have pain with resisted flexion of the leg at this area as well.  No pain over the piriformis.  Minimal discomfort in the back to palpation.  Neurovascular intact distally.  Limited musculoskeletal ultrasound was performed and interpreted by Lyndal Pulley  Limited ultrasound of patient's origin of her hamstring shows that there is hypoechoic changes but no true bursitis.  I do not see a true tear or any increasing neovascularization or Doppler flow in the area.  No cortical irregularity of the ischial area. Impression: Chronic hamstring tendinitis    Impression and Recommendations:     The above documentation has been reviewed and is accurate and complete Lyndal Pulley, DO

## 2020-06-20 ENCOUNTER — Ambulatory Visit: Payer: No Typology Code available for payment source | Admitting: Family Medicine

## 2020-06-25 ENCOUNTER — Other Ambulatory Visit: Payer: Self-pay | Admitting: Internal Medicine

## 2020-07-17 ENCOUNTER — Encounter: Payer: Self-pay | Admitting: Internal Medicine

## 2020-07-18 ENCOUNTER — Other Ambulatory Visit: Payer: Self-pay | Admitting: Internal Medicine

## 2020-07-18 MED ORDER — BUPROPION HCL ER (XL) 300 MG PO TB24: 300.0000 mg | ORAL_TABLET | Freq: Every day | ORAL | 1 refills | Status: DC

## 2020-07-19 ENCOUNTER — Ambulatory Visit (INDEPENDENT_AMBULATORY_CARE_PROVIDER_SITE_OTHER): Payer: No Typology Code available for payment source | Admitting: Family Medicine

## 2020-07-19 ENCOUNTER — Encounter: Payer: Self-pay | Admitting: Family Medicine

## 2020-07-19 ENCOUNTER — Other Ambulatory Visit: Payer: Self-pay

## 2020-07-19 VITALS — BP 122/82 | HR 67 | Ht 63.0 in | Wt 130.0 lb

## 2020-07-19 DIAGNOSIS — S76301A Unspecified injury of muscle, fascia and tendon of the posterior muscle group at thigh level, right thigh, initial encounter: Secondary | ICD-10-CM

## 2020-07-19 NOTE — Patient Instructions (Signed)
MRI right hip Exercises for IT/Glute [phone] [facility]  We will be in touch with results IF price is restrictive of [place] getting MRI then I would recommend physical therapy

## 2020-07-19 NOTE — Progress Notes (Signed)
[facility] [address] [place] Phone: [phone] Subjective:   I Ashley Barajas am serving as a Education administrator for Dr. Hulan Saas.  This visit occurred during the SARS-CoV-2 public health emergency.  Safety protocols were in place, including screening questions prior to the visit, additional usage of staff PPE, and extensive cleaning of exam room while observing appropriate contact time as indicated for disinfecting solutions.   I'm seeing this patient by the request  of:  Binnie Rail, MD  CC: Right hamstring pain  [id number]   05/11/2020 Patient does have more of a right hamstring injury.  Discussed icing regimen and home exercise, which activities to do which wants to avoid.  Patient did get a thigh compression sleeve, askling exercises encouraged.  Meloxicam given warned of potential side effects.  Worsening pain will consider formal physical therapy.  X-rays of the pelvis and the back ordered today with history of melanoma in the right buttocks but I think this will be highly unlikely and nothing on ultrasound was found.  Update 07/19/2020 Ashley Barajas is a 40 y.o. female coming in with complaint of right hamstring pain. Patient states she feels about the same and there hasn't been much of a difference. Leg and glut work increases pain.  Patient states that if she just walks and does not do any lower body she seems to do well but then has increasing discomfort.  The past medical history is significant for melanoma in the area.  Patient did have x-rays of the pelvis and back were that were fairly unremarkable.  These were independently visualized by me.       Past Medical History:  Diagnosis Date  . Amenorrhea, unspecified    states no menses in 9 yrs.  . Anemia   . Constipation   . ITP (idiopathic thrombocytopenic purpura)   . 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 Migraines    Past Surgical History:  Procedure  Laterality Date  . MELANOMA EXCISION Right 10/30/2016   Procedure: WIDE LOCAL EXCISION AND ADVANCEMENT FLAP CLOSURE DEFECT RIGHT BUTTOCK;  Surgeon: Stark Klein, MD;  Location: [facility];  Service: General;  Laterality: Right;  . NO PAST SURGERIES     Social History   Socioeconomic History  . Marital status: Married    Spouse name: Not on file  . Number of children: 2  . Years of education: Not on file  . Highest education level: Not on file  Occupational History  . Not on file  Tobacco Use  . Smoking status: Former Smoker    Quit date: 07/13/2005    Years since quitting: 15.0  . Smokeless tobacco: Never Used  Vaping Use  . Vaping Use: Never used  Substance and Sexual Activity  . Alcohol use: No  . Drug use: No  . Sexual activity: Not on file  Other Topics Concern  . 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 Adhesive [Tape] Rash  . Erythromycin Other (See Comments)    UNKNOWN - WAS AS A CHILD   Family History  Problem Relation Age of Onset  . Hypertension Mother   . Hypertension Father   . Cancer Son        leukemia    Current Outpatient Medications (Endocrine & Metabolic):  .  levonorgestrel (MIRENA, 52 MG,) 20 MCG/24HR IUD, Mirena   Current Outpatient Medications (Respiratory):  .  azelastine (ASTELIN) 0.1 % nasal spray, Place 1 spray into both nostrils 2 (two) times daily. Use in each nostril as directed .  fexofenadine (ALLEGRA) 180 MG tablet, Take 180 mg by mouth daily as needed for allergies or rhinitis.  Current Outpatient Medications (Analgesics):  .  meloxicam (MOBIC) 15 MG tablet, Take 1 tablet (15 mg total) by mouth daily. .   rizatriptan (MAXALT) 10 MG tablet, Take 1 tablet (10 mg total) by mouth as needed for migraine. May repeat in 2 hours if needed   Current Outpatient Medications (Other):  Marland Kitchen  buPROPion (WELLBUTRIN XL) 300 MG 24 hr tablet, Take 1 tablet (300 mg total) by mouth daily.   Reviewed prior external information including notes and imaging from  primary care provider As well as notes that were available from care everywhere and other healthcare systems.  Past medical history, social, surgical and family history all reviewed in electronic medical record.  No pertanent information unless stated regarding to the chief complaint.   Review of Systems:  No headache, visual changes, nausea, vomiting, diarrhea, constipation, dizziness, abdominal pain, skin rash, fevers, chills, night sweats, weight loss, swollen lymph nodes, body aches, joint swelling, chest pain, shortness of breath, mood changes. POSITIVE muscle aches  Objective  Blood pressure 122/82, pulse 67, height 5\' 3"  (1.6 m), weight 130 lb (59 kg), SpO2 99 %.   General: No apparent distress alert and oriented x3 mood and affect normal, dressed appropriately.  HEENT: Pupils equal, extraocular movements intact  Respiratory: Patient's speak in full sentences and does not appear short of breath  Cardiovascular: No lower extremity edema, non tender, no erythema  Right hip exam shows the patient has very minimal tender over the gluteal tendon laterally.  Patient has a negative .  Patient does have some mild pain though with straight leg test.  Patient has increasing discomfort though with stretching of the hamstring in a standing position.  No significant discomfort in the back whatsoever.  5 out of 5 strength noted.    Impression and Recommendations:     The above documentation has been reviewed and is accurate and complete Ashley Brownie, DO

## 2020-07-19 NOTE — Assessment & Plan Note (Signed)
Patient continues to have a pain and seems to be out of proportion to than what we have found on imaging previously as well as with patient being young and fit not making significant progress.  Patient does have a history of melanoma in this area.  I do feel with x-ray is normal this is a low likelihood but also patient is not improving.  Do feel MRI of hip would be beneficial to look at soft tissue more of the posterior musculature as well as the lateral musculature.  Not concerned for any type of intra-articular injury at this time.  Follow-up with me again after imaging to discuss

## 2020-08-15 ENCOUNTER — Other Ambulatory Visit: Payer: No Typology Code available for payment source

## 2020-08-16 ENCOUNTER — Other Ambulatory Visit: Payer: Self-pay

## 2020-08-16 ENCOUNTER — Ambulatory Visit
Admission: RE | Admit: 2020-08-16 | Discharge: 2020-08-16 | Disposition: A | Discharge status: Home / Self Care | Payer: No Typology Code available for payment source | Source: Ambulatory Visit | Attending: Family Medicine | Admitting: Family Medicine

## 2020-08-16 DIAGNOSIS — S76301A Unspecified injury of muscle, fascia and tendon of the posterior muscle group at thigh level, right thigh, initial encounter: Secondary | ICD-10-CM

## 2020-08-17 ENCOUNTER — Encounter: Payer: Self-pay | Admitting: Family Medicine

## 2020-08-20 ENCOUNTER — Other Ambulatory Visit: Payer: Self-pay | Admitting: Family Medicine

## 2020-08-20 MED ORDER — MELOXICAM 15 MG PO TABS
15.0000 mg | ORAL_TABLET | Freq: Every day | ORAL | 0 refills | Status: DC
Start: 2020-08-20 — End: 2020-11-07

## 2020-11-06 ENCOUNTER — Encounter: Payer: Self-pay | Admitting: Internal Medicine

## 2020-11-07 ENCOUNTER — Other Ambulatory Visit: Payer: Self-pay

## 2020-11-07 MED ORDER — BUPROPION HCL ER (XL) 150 MG PO TB24
ORAL_TABLET | Freq: Every day | ORAL | 1 refills | Status: DC
  Filled 2020-11-07 – 2021-01-01 (×3): qty 90, 90d supply, fill #0
  Filled 2021-04-07: qty 90, 90d supply, fill #1

## 2020-11-20 ENCOUNTER — Other Ambulatory Visit: Payer: Self-pay

## 2020-12-19 ENCOUNTER — Other Ambulatory Visit: Payer: Self-pay

## 2020-12-21 ENCOUNTER — Other Ambulatory Visit: Payer: Self-pay

## 2020-12-25 ENCOUNTER — Other Ambulatory Visit: Payer: Self-pay

## 2020-12-25 ENCOUNTER — Other Ambulatory Visit: Payer: Self-pay | Admitting: Internal Medicine

## 2020-12-25 MED ORDER — RIZATRIPTAN BENZOATE 10 MG PO TABS
10.0000 mg | ORAL_TABLET | ORAL | 8 refills | Status: DC | PRN
  Filled 2020-12-25: qty 10, 17d supply, fill #0

## 2021-01-01 ENCOUNTER — Other Ambulatory Visit: Payer: Self-pay

## 2021-01-17 DIAGNOSIS — Z6823 Body mass index (BMI) 23.0-23.9, adult: Secondary | ICD-10-CM | POA: Diagnosis not present

## 2021-01-17 DIAGNOSIS — Z01419 Encounter for gynecological examination (general) (routine) without abnormal findings: Secondary | ICD-10-CM | POA: Diagnosis not present

## 2021-02-14 ENCOUNTER — Ambulatory Visit
Admission: RE | Admit: 2021-02-14 | Discharge: 2021-02-14 | Disposition: A | Discharge status: Home / Self Care | Payer: BC Managed Care – PPO | Source: Ambulatory Visit | Attending: Emergency Medicine | Admitting: Emergency Medicine

## 2021-02-14 ENCOUNTER — Other Ambulatory Visit: Payer: Self-pay

## 2021-02-14 VITALS — BP 114/78 | HR 82 | Temp 99.2°F | Resp 18

## 2021-02-14 DIAGNOSIS — R519 Headache, unspecified: Secondary | ICD-10-CM | POA: Diagnosis not present

## 2021-02-14 DIAGNOSIS — M62838 Other muscle spasm: Secondary | ICD-10-CM

## 2021-02-14 MED ORDER — METHOCARBAMOL 500 MG PO TABS
500.0000 mg | ORAL_TABLET | Freq: Two times a day (BID) | ORAL | 0 refills | Status: DC | PRN
  Filled 2021-02-14: qty 10, 5d supply, fill #0

## 2021-02-14 MED ORDER — DEXAMETHASONE SODIUM PHOSPHATE 10 MG/ML IJ SOLN
10.0000 mg | Freq: Once | INTRAMUSCULAR | Status: AC
  Administered 2021-02-14: 10 mg via INTRAMUSCULAR

## 2021-02-14 MED ORDER — KETOROLAC TROMETHAMINE 30 MG/ML IJ SOLN
30.0000 mg | Freq: Once | INTRAMUSCULAR | Status: AC
  Administered 2021-02-14: 30 mg via INTRAMUSCULAR

## 2021-02-14 NOTE — ED Triage Notes (Signed)
Patient c/o LFT sided neck pain and Migraines x 4 days.   Patient denies N/V.   Patient endorses photosensitivity.   Patient endorses having COVID 2 weeks ago. Patient endorses an episode of neck pain when having COVID.   Patient endorses some pain with movement of neck.   Patient has taken Ibuprofen, Tylenol, and Maxalt with no relief of symptoms.

## 2021-02-14 NOTE — Discharge Instructions (Addendum)
You were given an injection of dexamethasone and ketorolac today for your headache.  Do not take additional pain relievers today.    Take the muscle relaxer methocarbamol as directed.  Do not drive, operate machinery, or drink alcohol with this medication as it may cause drowsiness.    Go to the emergency department if you have acute headache, neck pain, or other concerning symptoms.    Follow up with your primary care provider if your symptoms are not improving.

## 2021-02-14 NOTE — ED Provider Notes (Signed)
Ashley Barajas    CSN: [id number] Arrival date & time: 02/14/21  1236      History   Chief Complaint Chief Complaint  Patient presents with   Migraine   Neck Pain   APPT 1245    HPI Ashley Barajas is a 40 y.o. female.   Patient presents with 4-day history of "migraine" headache and left side neck pain.  She reports associated photosensitivity.  Her neck pain is worse with movement and feels like a muscle spasm.  No falls or injury.  She denies fever, chills, chest pain, shortness of breath, abdominal pain, numbness, weakness, paresthesias, or other symptoms.  This is not the worst headache of her life and feels like her usual migraines.  Treatment at home with ibuprofen; last dose taken earlier this morning.  She reports having COVID 2 weeks ago.  Her medical history includes migraine headaches, ITP, anemia, melanoma, depression, anxiety.  The history is provided by the patient and medical records.   Past Medical History:  Diagnosis Date   Amenorrhea, unspecified    states no menses in 9 yrs.   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   Migraines     Patient Active Problem List   Diagnosis Date Noted   Right hamstring injury 05/11/2020   History of melanoma 10/26/2016   Migraines 05/13/2016   Greater trochanteric bursitis of right hip 05/07/2016   ITP (idiopathic thrombocytopenic purpura) 04/30/2015   Depression 04/30/2015   Fatigue 04/30/2015   Transient gluten sensitivity 04/30/2015   Generalized anxiety disorder 04/30/2015    Past Surgical History:  Procedure Laterality Date   MELANOMA EXCISION Right 10/30/2016   Procedure: WIDE LOCAL EXCISION AND ADVANCEMENT FLAP CLOSURE DEFECT RIGHT BUTTOCK;  Surgeon: Stark Klein, MD;  Location: [facility];  Service: General;  Laterality: Right;   NO PAST SURGERIES      OB History   No obstetric history on file.      Home  Medications    Prior to Admission medications   Medication Sig Start Date End Date Taking? Authorizing Provider  methocarbamol (ROBAXIN) 500 MG tablet Take 1 tablet (500 mg total) by mouth 2 (two) times daily as needed for muscle spasms. 02/14/21  Yes Sharion Balloon, NP  rizatriptan (MAXALT) 10 MG tablet Take 1 tablet (10 mg total) by mouth as needed for migraine. May repeat in 2 hours if needed 12/25/20  Yes Burns, Claudina Lick, MD  azelastine (ASTELIN) 0.1 % nasal spray Place 1 spray into both nostrils 2 (two) times daily. Use in each nostril as directed 08/19/18   Darlin Priestly, PA-C  buPROPion (WELLBUTRIN XL) 150 MG 24 hr tablet TAKE 1 TABLET BY MOUTH DAILY. 11/07/20 11/07/21  Binnie Rail, MD  fexofenadine (ALLEGRA) 180 MG tablet Take 180 mg by mouth daily as needed for allergies or rhinitis.    [provider]  levonorgestrel (MIRENA, 52 MG,) 20 MCG/24HR IUD Mirena 11/02/15   [provider]  meloxicam (MOBIC) 15 MG tablet TAKE 1 TABLET BY MOUTH DAILY. 08/20/20 08/20/21  Lyndal Pulley, DO    Family History Family History  Problem Relation Age of Onset   Hypertension Mother    Hypertension Father    Cancer Son        leukemia    Social History Social History   Tobacco Use   Smoking status: Former  Types: Cigarettes    Quit date: 07/13/2005    Years since quitting: 15.6   Smokeless tobacco: Never  Vaping Use   Vaping Use: Never used  Substance Use Topics   Alcohol use: No   Drug use: No     Allergies   Adhesive [tape] and Erythromycin   Review of Systems Review of Systems  Constitutional:  Negative for chills and fever.  Eyes:  Positive for photophobia. Negative for pain and visual disturbance.  Respiratory:  Negative for cough and shortness of breath.   Cardiovascular:  Negative for chest pain and palpitations.  Gastrointestinal:  Negative for abdominal pain and vomiting.  Musculoskeletal:  Positive for neck pain. Negative for arthralgias, back pain  and gait problem.  Skin:  Negative for color change and rash.  Neurological:  Positive for headaches. Negative for dizziness, tremors, seizures, syncope, facial asymmetry, speech difficulty, weakness, light-headedness and numbness.  All other systems reviewed and are negative.   Physical Exam Triage Vital Signs ED Triage Vitals  Enc Vitals Group     BP      Pulse      Resp      Temp      Temp src      SpO2      Weight      Height      Head Circumference      Peak Flow      Pain Score      Pain Loc      Pain Edu?      Excl. in [place]?    No data found.  Updated Vital Signs BP 114/78 (BP Location: Left Arm)   Pulse 82   Temp 99.2 F (37.3 C) (Oral)   Resp 18   LMP  (LMP Unknown)   SpO2 98%   Visual Acuity Right Eye Distance:   Left Eye Distance:   Bilateral Distance:    Right Eye Near:   Left Eye Near:    Bilateral Near:     Physical Exam Vitals and nursing note reviewed.  Constitutional:      General: She is not in acute distress.    Appearance: She is well-developed. She is not ill-appearing.  HENT:     Head: Normocephalic and atraumatic.     Right Ear: Tympanic membrane normal.     Left Ear: Tympanic membrane normal.     Nose: Nose normal.     Mouth/Throat:     Mouth: Mucous membranes are moist.     Pharynx: Oropharynx is clear.  Eyes:     Conjunctiva/sclera: Conjunctivae normal.  Cardiovascular:     Rate and Rhythm: Normal rate and regular rhythm.     Heart sounds: Normal heart sounds.  Pulmonary:     Effort: Pulmonary effort is normal. No respiratory distress.     Breath sounds: Normal breath sounds.  Abdominal:     Palpations: Abdomen is soft.     Tenderness: There is no abdominal tenderness.  Musculoskeletal:        General: No swelling or deformity. Normal range of motion.     Cervical back: Normal range of motion and neck supple. No rigidity.  Skin:    General: Skin is warm and dry.     Findings: No bruising, erythema, lesion or rash.   Neurological:     General: No focal deficit present.     Mental Status: She is alert and oriented to person, place, and time.     Cranial Nerves:  No cranial nerve deficit.     Sensory: No sensory deficit.     Motor: No weakness.     Gait: Gait normal.  Psychiatric:        Mood and Affect: Mood normal.        Behavior: Behavior normal.     UC Treatments / Results  Labs (all labs ordered are listed, but only abnormal results are displayed) Labs Reviewed - No data to display  EKG   Radiology No results found.  Procedures Procedures (including critical care time)  Medications Ordered in UC Medications  ketorolac (TORADOL) 30 MG/ML injection 30 mg (30 mg Intramuscular Given 02/14/21 1316)  dexamethasone (DECADRON) injection 10 mg (10 mg Intramuscular Given 02/14/21 1315)    Initial Impression / Assessment and Plan / UC Course  I have reviewed the triage vital signs and the nursing notes.  Pertinent labs & imaging results that were available during my care of the patient were reviewed by me and considered in my medical decision making (see chart for details).  Acute non-intractable headache, muscle spasm of neck.  Treated with dexamethasone and ketorolac here.  Patient instructed to avoid OTC pain relievers for the rest of the day.  Treating muscle spasms with methocarbamol.  Precautions for drowsiness with this medication discussed.  Strict ED precautions discussed.  Instructed her to follow-up with her PCP if her symptoms or not improving.  She agrees to plan of care.   Final Clinical Impressions(s) / UC Diagnoses   Final diagnoses:  Acute nonintractable headache, unspecified headache type  Muscle spasms of neck     Discharge Instructions      You were given an injection of dexamethasone and ketorolac today for your headache.  Do not take additional pain relievers today.    Take the muscle relaxer methocarbamol as directed.  Do not drive, operate machinery, or drink  alcohol with this medication as it may cause drowsiness.    Go to the emergency department if you have acute headache, neck pain, or other concerning symptoms.    Follow up with your primary care provider if your symptoms are not improving.         ED Prescriptions     Medication Sig Dispense Auth. Provider   methocarbamol (ROBAXIN) 500 MG tablet Take 1 tablet (500 mg total) by mouth 2 (two) times daily as needed for muscle spasms. 10 tablet Sharion Balloon, NP      PDMP not reviewed this encounter.   Sharion Balloon, NP 02/14/21 1318

## 2021-04-08 ENCOUNTER — Other Ambulatory Visit: Payer: Self-pay

## 2021-05-23 ENCOUNTER — Ambulatory Visit: Payer: BC Managed Care – PPO | Admitting: Internal Medicine

## 2021-08-14 NOTE — Progress Notes (Signed)
Subjective:    Patient ID: Ashley Barajas, female    DOB: [birth year], 41 y.o.   MRN: [id number]  This visit occurred during the SARS-CoV-2 public health emergency.  Safety protocols were in place, including screening questions prior to the visit, additional usage of staff PPE, and extensive cleaning of exam room while observing appropriate contact time as indicated for disinfecting solutions.    HPI The patient is here for an acute visit.   back pain - started one week ago.  It is mostly on the left side and radiates across the lower back. Not worse with any position/acitivty.  Took meloxicam / advil helped a little.  She only took the meloxicam once.  No radiation or N/T.  She denies a history of back pain.  She denies any activity that may have caused it.   Stomach started 4-5 months ago.  She would notice when she would bend over to do something.  It was just next to the belly button.  If she has been sitting a long time and she goes to get up it hurts. It feels like there is a knot or pressure there. Not gotten worse - feels it daily she denies any bulges  BM regular - taking miralax three times a week.     Medications and allergies reviewed with patient and updated if appropriate.  Patient Active Problem List   Diagnosis Date Noted   Left sided abdominal pain 08/15/2021   Low back pain 08/15/2021   Right hamstring injury 05/11/2020   History of melanoma 10/26/2016   Migraines 05/13/2016   Greater trochanteric bursitis of right hip 05/07/2016   ITP (idiopathic thrombocytopenic purpura) 04/30/2015   Depression 04/30/2015   Fatigue 04/30/2015   Transient gluten sensitivity 04/30/2015   Generalized anxiety disorder 04/30/2015    Current Outpatient Medications on File Prior to Visit  Medication Sig Dispense Refill   azelastine (ASTELIN) 0.1 % nasal spray Place 1 spray into both nostrils 2 (two) times daily. Use in each nostril as directed 30 mL 0   fexofenadine (ALLEGRA)  180 MG tablet Take 180 mg by mouth daily as needed for allergies or rhinitis.     levonorgestrel (MIRENA, 52 MG,) 20 MCG/24HR IUD Mirena     methocarbamol (ROBAXIN) 500 MG tablet Take 1 tablet (500 mg total) by mouth 2 (two) times daily as needed for muscle spasms. 10 tablet 0   rizatriptan (MAXALT) 10 MG tablet Take 1 tablet (10 mg total) by mouth as needed for migraine. May repeat in 2 hours if needed 10 tablet 8   No current facility-administered medications on file prior to visit.    Past Medical History:  Diagnosis Date   Amenorrhea, unspecified    states no menses in 9 yrs.   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   Migraines     Past Surgical History:  Procedure Laterality Date   MELANOMA EXCISION Right 10/30/2016   Procedure: WIDE LOCAL EXCISION AND ADVANCEMENT FLAP CLOSURE DEFECT RIGHT BUTTOCK;  Surgeon: Stark Klein, MD;  Location: [facility];  Service: General;  Laterality: Right;   NO PAST SURGERIES      Social History   Socioeconomic History   Marital status: Married    Spouse name: Not on file   Number of children: 2   Years of education: Not on file   Highest education level: Not  on file  Occupational History   Not on file  Tobacco Use   Smoking status: Former    Types: Cigarettes    Quit date: 07/13/2005    Years since quitting: 16.1   Smokeless tobacco: Never  Vaping Use   Vaping Use: Never used  Substance and Sexual Activity   Alcohol use: No   Drug use: No   Sexual activity: Not on file  Other Topics Concern   Not on file  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  Gastrointestinal:  Positive for abdominal pain and constipation (controlled). Negative for blood in stool and nausea.  Genitourinary:  Negative for difficulty urinating, dysuria, frequency, hematuria and urgency.  Neurological:  Positive for headaches. Negative for light-headedness and numbness.      Objective:   Vitals:   08/15/21 1030  BP: 102/78  Pulse: 79  Temp: 97.9 F (36.6 C)  SpO2: 99%   BP Readings from Last 3 Encounters:  08/15/21 102/78  02/14/21 114/78  07/19/20 122/82   Wt Readings from Last 3 Encounters:  08/15/21 129 lb 9.6 oz (58.8 kg)  07/19/20 130 lb (59 kg)  05/11/20 126 lb (57.2 kg)   Body mass index is 22.96 kg/m.   Physical Exam Constitutional:      General: She is not in acute distress.    Appearance: She is well-developed. She is not ill-appearing.  HENT:     Head: Normocephalic and atraumatic.  Abdominal:     General: Abdomen is flat. There is no distension.     Palpations: Abdomen is soft.     Tenderness: There is no abdominal tenderness. There is no guarding or rebound.     Hernia: No hernia (No obvious hernia) is present.  Musculoskeletal:     Lumbar back: No deformity, spasms, tenderness or bony tenderness.  Skin:    General: Skin is warm and dry.  Neurological:     Mental Status: She is alert.           Assessment & Plan:    See Problem List for Assessment and Plan of chronic medical problems.

## 2021-08-15 ENCOUNTER — Encounter: Payer: Self-pay | Admitting: Internal Medicine

## 2021-08-15 ENCOUNTER — Other Ambulatory Visit: Payer: Self-pay

## 2021-08-15 ENCOUNTER — Ambulatory Visit: Payer: BC Managed Care – PPO | Admitting: Internal Medicine

## 2021-08-15 VITALS — BP 102/78 | HR 79 | Temp 97.9°F | Ht 63.0 in | Wt 129.6 lb

## 2021-08-15 DIAGNOSIS — R109 Unspecified abdominal pain: Secondary | ICD-10-CM | POA: Diagnosis not present

## 2021-08-15 DIAGNOSIS — M545 Low back pain, unspecified: Secondary | ICD-10-CM

## 2021-08-15 DIAGNOSIS — F3289 Other specified depressive episodes: Secondary | ICD-10-CM

## 2021-08-15 MED ORDER — BUPROPION HCL ER (XL) 150 MG PO TB24
ORAL_TABLET | Freq: Every day | ORAL | 1 refills | Status: DC
  Filled 2021-08-15: qty 90, 90d supply, fill #0

## 2021-08-15 MED ORDER — MELOXICAM 15 MG PO TABS
ORAL_TABLET | Freq: Every day | ORAL | 0 refills | Status: DC
  Filled 2021-08-15: qty 30, 30d supply, fill #0

## 2021-08-15 NOTE — Assessment & Plan Note (Signed)
Acute Left The pain started 1 week ago No pain on exam Pain radiates across back, but no pain into legs, no numbness/tingling Took meloxicam once which did not help Much ?  SI joint dysfunction We will try meloxicam 15 mg daily with food for 2 weeks-refilled and sent to pharmacy Can try ice, heat If no improvement can refer to sports medicine or consider physical therapy

## 2021-08-15 NOTE — Assessment & Plan Note (Signed)
Acute Started 4-5 months ago and is intermittent-has not gotten worse Typically pain occurs with changes in position Pain located left mid abdomen just lateral of umbilicus ?  Hernia No change in bowels, urine symptoms or GU symptoms Will get abdominal ultrasound Further evaluation based on results from ultrasound

## 2021-08-15 NOTE — Patient Instructions (Addendum)
° ° ° °  Medications changes include :   meloxciam 15 mg daily with food  Your prescription(s) have been submitted to your pharmacy. Please take as directed and contact our office if you believe you are having problem(s) with the medication(s).   A abdominal ultrasound was ordered.   Someone from their office will call you to schedule an appointment.

## 2021-08-15 NOTE — Assessment & Plan Note (Signed)
Chronic Depression controlled-PHQ-9 score is 2 Continue Wellbutrin XL 150 mg daily-refill sent to pharmacy

## 2021-08-19 ENCOUNTER — Encounter: Payer: Self-pay | Admitting: Internal Medicine

## 2021-08-19 DIAGNOSIS — M545 Low back pain, unspecified: Secondary | ICD-10-CM

## 2021-08-20 ENCOUNTER — Other Ambulatory Visit: Payer: Self-pay

## 2021-08-20 ENCOUNTER — Ambulatory Visit
Admission: RE | Admit: 2021-08-20 | Discharge: 2021-08-20 | Disposition: A | Discharge status: Home / Self Care | Payer: BC Managed Care – PPO | Source: Ambulatory Visit | Attending: Emergency Medicine | Admitting: Emergency Medicine

## 2021-08-20 VITALS — BP 121/78 | HR 68 | Temp 97.9°F | Resp 18

## 2021-08-20 DIAGNOSIS — M533 Sacrococcygeal disorders, not elsewhere classified: Secondary | ICD-10-CM | POA: Diagnosis not present

## 2021-08-20 DIAGNOSIS — M545 Low back pain, unspecified: Secondary | ICD-10-CM

## 2021-08-20 DIAGNOSIS — M47896 Other spondylosis, lumbar region: Secondary | ICD-10-CM | POA: Diagnosis not present

## 2021-08-20 DIAGNOSIS — S39012A Strain of muscle, fascia and tendon of lower back, initial encounter: Secondary | ICD-10-CM | POA: Diagnosis not present

## 2021-08-20 LAB — POCT URINALYSIS DIP (MANUAL ENTRY)
Bilirubin, UA: NEGATIVE
Blood, UA: NEGATIVE
Glucose, UA: NEGATIVE mg/dL
Ketones, POC UA: NEGATIVE mg/dL
Leukocytes, UA: NEGATIVE
Nitrite, UA: NEGATIVE
Protein Ur, POC: NEGATIVE mg/dL
Spec Grav, UA: 1.015 (ref 1.010–1.025)
Urobilinogen, UA: 0.2 E.U./dL
pH, UA: 7 (ref 5.0–8.0)

## 2021-08-20 MED ORDER — CYCLOBENZAPRINE HCL 5 MG PO TABS
ORAL_TABLET | ORAL | 0 refills | Status: DC
  Filled 2021-08-20: qty 15, 5d supply, fill #0

## 2021-08-20 MED ORDER — METHYLPREDNISOLONE 4 MG PO TBPK
ORAL_TABLET | ORAL | 0 refills | Status: DC
  Filled 2021-08-20: qty 21, 6d supply, fill #0

## 2021-08-20 MED ORDER — CYCLOBENZAPRINE HCL 10 MG PO TABS
10.0000 mg | ORAL_TABLET | Freq: Two times a day (BID) | ORAL | 0 refills | Status: DC | PRN
  Filled 2021-08-20: qty 20, 10d supply, fill #0

## 2021-08-20 NOTE — ED Triage Notes (Signed)
Pt here with left back, abdominal, and left hip pain x 2 weeks. Pt denies urinary sx.

## 2021-08-20 NOTE — ED Provider Notes (Signed)
Roderic Palau    CSN: [id number] Arrival date & time: 08/20/21  1056      History   Chief Complaint Chief Complaint  Patient presents with   Back Pain   Abdominal Pain   Hip Pain    HPI Ashley Barajas is a 41 y.o. female.  Patient presents with left lower back x2 weeks.  No falls or injury.  The pain is constant; becomes sharp and intense with movement and twisting; worse with sitting and walking; improves with lying prone; radiates to left hip and groin.  She denies numbness, weakness, paresthesias, saddle anesthesia, loss of bowel/bladder control, abdominal pain, dysuria, hematuria, vaginal discharge, pelvic pain, or other symptoms.   Treatment at home with meloxicam and tylenol.  Patient was seen by her PCP on 08/15/2021 for left-sided abdominal pain and left lower back pain; treated with meloxicam; instructed to see sports medicine if not improving.  She denies current abdominal pain.  She denies current pregnancy or breastfeeding.        The history is provided by the patient and medical records.   Past Medical History:  Diagnosis Date   Amenorrhea, unspecified    states no menses in 9 yrs.   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   right   Migraines     Patient Active Problem List   Diagnosis Date Noted   Left sided abdominal pain 08/15/2021   Low back pain 08/15/2021   Right hamstring injury 05/11/2020   History of melanoma 10/26/2016   Migraines 05/13/2016   Greater trochanteric bursitis of right hip 05/07/2016   ITP (idiopathic thrombocytopenic purpura) 04/30/2015   Depression 04/30/2015   Fatigue 04/30/2015   Transient gluten sensitivity 04/30/2015   Generalized anxiety disorder 04/30/2015    Past Surgical History:  Procedure Laterality Date   MELANOMA EXCISION Right 10/30/2016   Procedure: WIDE LOCAL EXCISION AND ADVANCEMENT FLAP CLOSURE DEFECT RIGHT BUTTOCK;  Surgeon: Stark Klein,  MD;  Location: [facility];  Service: General;  Laterality: Right;   NO PAST SURGERIES      OB History   No obstetric history on file.      Home Medications    Prior to Admission medications   Medication Sig Start Date End Date Taking? Authorizing Provider  cyclobenzaprine (FLEXERIL) 10 MG tablet Take 1 tablet (10 mg total) by mouth 2 (two) times daily as needed for muscle spasms. 08/20/21  Yes Sharion Balloon, NP  azelastine (ASTELIN) 0.1 % nasal spray Place 1 spray into both nostrils 2 (two) times daily. Use in each nostril as directed 08/19/18   Darlin Priestly, PA-C  buPROPion (WELLBUTRIN XL) 150 MG 24 hr tablet TAKE 1 TABLET BY MOUTH DAILY. 08/15/21 08/15/22  Binnie Rail, MD  fexofenadine (ALLEGRA) 180 MG tablet Take 180 mg by mouth daily as needed for allergies or rhinitis.    [provider]  levonorgestrel (MIRENA, 52 MG,) 20 MCG/24HR IUD Mirena 11/02/15   [provider]  meloxicam (MOBIC) 15 MG tablet TAKE 1 TABLET BY MOUTH DAILY. 08/15/21 08/15/22  Binnie Rail, MD  rizatriptan (MAXALT) 10 MG tablet Take 1 tablet (10 mg total) by mouth as needed for migraine. May repeat in 2 hours if needed 12/25/20   Binnie Rail, MD    Family History Family History  Problem Relation Age of Onset   Hypertension Mother    Hypertension Father  Cancer Son        leukemia    Social History Social History   Tobacco Use   Smoking status: Former    Types: Cigarettes    Quit date: 07/13/2005    Years since quitting: 16.1   Smokeless tobacco: Never  Vaping Use   Vaping Use: Never used  Substance Use Topics   Alcohol use: No   Drug use: No     Allergies   Adhesive [tape] and Erythromycin   Review of Systems Review of Systems  Constitutional:  Negative for chills and fever.  Respiratory:  Negative for cough and shortness of breath.   Cardiovascular:  Negative for chest pain and palpitations.  Gastrointestinal:  Negative for abdominal pain, nausea  and vomiting.  Genitourinary:  Negative for dysuria, hematuria, pelvic pain and vaginal discharge.  Musculoskeletal:  Positive for back pain. Negative for arthralgias and joint swelling.  Skin:  Negative for color change and rash.  Neurological:  Negative for weakness and numbness.  All other systems reviewed and are negative.   Physical Exam Triage Vital Signs ED Triage Vitals  Enc Vitals Group     BP      Pulse      Resp      Temp      Temp src      SpO2      Weight      Height      Head Circumference      Peak Flow      Pain Score      Pain Loc      Pain Edu?      Excl. in [place]?    No data found.  Updated Vital Signs BP 121/78    Pulse 68    Temp 97.9 F (36.6 C)    Resp 18    SpO2 98%   Visual Acuity Right Eye Distance:   Left Eye Distance:   Bilateral Distance:    Right Eye Near:   Left Eye Near:    Bilateral Near:     Physical Exam Vitals and nursing note reviewed.  Constitutional:      General: She is not in acute distress.    Appearance: She is well-developed. She is not ill-appearing.  HENT:     Mouth/Throat:     Mouth: Mucous membranes are moist.  Cardiovascular:     Rate and Rhythm: Normal rate and regular rhythm.     Heart sounds: Normal heart sounds.  Pulmonary:     Effort: Pulmonary effort is normal. No respiratory distress.     Breath sounds: Normal breath sounds.  Abdominal:     Palpations: Abdomen is soft.     Tenderness: There is no abdominal tenderness. There is no right CVA tenderness, left CVA tenderness, guarding or rebound.  Musculoskeletal:        General: Tenderness present. No swelling or deformity. Normal range of motion.     Cervical back: Neck supple.       Back:     Comments: Mild tenderness to palpation of left lower back.   Skin:    General: Skin is warm and dry.     Capillary Refill: Capillary refill takes less than 2 seconds.     Findings: No bruising, erythema, lesion or rash.  Neurological:     General: No focal  deficit present.     Mental Status: She is alert and oriented to person, place, and time.     Sensory: No  sensory deficit.     Motor: No weakness.     Gait: Gait normal.  Psychiatric:        Mood and Affect: Mood normal.        Behavior: Behavior normal.     UC Treatments / Results  Labs (all labs ordered are listed, but only abnormal results are displayed) Labs Reviewed  POCT URINALYSIS DIP (MANUAL ENTRY)    EKG   Radiology No results found.  Procedures Procedures (including critical care time)  Medications Ordered in UC Medications - No data to display  Initial Impression / Assessment and Plan / UC Course  I have reviewed the triage vital signs and the nursing notes.  Pertinent labs & imaging results that were available during my care of the patient were reviewed by me and considered in my medical decision making (see chart for details).    Acute left lower back pain.  No indication of injury or infection.  Treating with Flexeril and continued use of meloxicam. Precautions for drowsiness with Flexeril discussed.  Instructed patient to follow up with orthopedist.  She agrees to plan of care.   Final Clinical Impressions(s) / UC Diagnoses   Final diagnoses:  Acute left-sided low back pain without sciatica     Discharge Instructions      Continue the meloxicam as prescribed. Take the muscle relaxer as needed for muscle spasm; Do not drive, operate machinery, or drink alcohol with this medication as it can cause drowsiness.   Follow up with an orthopedist such as the one listed below.         ED Prescriptions     Medication Sig Dispense Auth. Provider   cyclobenzaprine (FLEXERIL) 10 MG tablet Take 1 tablet (10 mg total) by mouth 2 (two) times daily as needed for muscle spasms. 20 tablet Sharion Balloon, NP      I have reviewed the PDMP during this encounter.   Sharion Balloon, NP 08/20/21 1204

## 2021-08-20 NOTE — Addendum Note (Signed)
Addended by: Binnie Rail on: 08/20/2021 07:57 PM   Modules accepted: Orders

## 2021-08-20 NOTE — Telephone Encounter (Signed)
Pt would like the referral to Sports Med.. Will follow up with pt when referral is placed.

## 2021-08-20 NOTE — Discharge Instructions (Addendum)
Continue the meloxicam as prescribed. Take the muscle relaxer as needed for muscle spasm; Do not drive, operate machinery, or drink alcohol with this medication as it can cause drowsiness.   Follow up with an orthopedist such as the one listed below.

## 2021-08-21 ENCOUNTER — Ambulatory Visit (INDEPENDENT_AMBULATORY_CARE_PROVIDER_SITE_OTHER): Payer: BC Managed Care – PPO | Admitting: Family Medicine

## 2021-08-21 ENCOUNTER — Ambulatory Visit (INDEPENDENT_AMBULATORY_CARE_PROVIDER_SITE_OTHER): Payer: BC Managed Care – PPO

## 2021-08-21 VITALS — BP 110/80 | HR 56 | Ht 63.0 in | Wt 131.0 lb

## 2021-08-21 DIAGNOSIS — G8929 Other chronic pain: Secondary | ICD-10-CM | POA: Diagnosis not present

## 2021-08-21 DIAGNOSIS — M545 Low back pain, unspecified: Secondary | ICD-10-CM

## 2021-08-21 DIAGNOSIS — M1612 Unilateral primary osteoarthritis, left hip: Secondary | ICD-10-CM | POA: Diagnosis not present

## 2021-08-21 DIAGNOSIS — M25551 Pain in right hip: Secondary | ICD-10-CM | POA: Diagnosis not present

## 2021-08-21 DIAGNOSIS — M25552 Pain in left hip: Secondary | ICD-10-CM

## 2021-08-21 NOTE — Progress Notes (Signed)
I, Peterson Lombard, LAT, ATC acting as a scribe for Lynne Leader, MD.  Ashley Barajas is a 41 y.o. female who presents to [place] at [facility] today for low back pain. Pt was previously seen by Dr. Tamala Julian on 07/19/20 for a R hamstring injury and prior to that on 05/11/20 for R leg pain. Today, pt c/o LBP x for about 2 weeks now, since starting the medrol and flexeril the pain has gotten better. Pt locates pain to on her left lower back. Pain Is worse when sitting pain radiates into the hip and top of the thigh and into the left pelvic area  Radiating pain: yes  LE numbness/tingling:no LE weakness:no Aggravates:just sitting , bending , twisting  Treatments tried:flexeril, medrol, ice and heat   Dx imaging: 08/16/20 R hip MRI  05/11/20 L-spine & R hip XR   Pertinent review of systems: no fever of chills  Relevant historical information: ITP. 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   Exam:  BP 110/80    Pulse (!) 56    Ht 5\' 3"  (1.6 m)    Wt 131 lb (59.4 kg)    SpO2 99%    BMI 23.21 kg/m  General: Well Developed, well nourished, and in no acute distress.   MSK: L-spine: Nontender midline.  Not particularly tender paraspinal musculature. Decreased lumbar motion with pain with extension flexion left rotation and lateral flexion all the limits of range of motion are intact. Lower extremity strength are is intact Reflexes and sensation are intact and equal bilaterally.  Left hip: Normal-appearing Normal motion. SI joint motion is free bilaterally without pain.    Lab and Radiology Results X-ray images L-spine and left hip obtained today personally and independently interpreted  L-spine: Normal-appearing lumbar spine without significant DDD.  No acute fractures are visible.  IUD visible in the pelvis.  Left hip: Minimal sclerosis at pubic symphysis.  No significant DJD left hip.  Normal-appearing pelvis.  Again IUD visible in  pelvis  Await formal radiology review    Assessment and Plan: 41 y.o. 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  Plan for physical therapy.  Recommend heating pad TENS unit.  She already is experiencing some benefit with Medrol Dosepak prescribed in urgent care.  Continue Flexeril as needed at bedtime.  Recheck back with me in 6 weeks especially as needed.   PDMP not reviewed this encounter. Orders Placed This Encounter  Procedures   DG HIP UNILAT WITH PELVIS 2-3 VIEWS LEFT    Standing Status:   Future    Number of Occurrences:   1    Standing Expiration Date:   08/21/2022    Order Specific Question:   Reason for Exam (SYMPTOM  OR DIAGNOSIS REQUIRED)    Answer:   hip pain    Order Specific Question:   Is patient pregnant?    Answer:   No    Order Specific Question:   Preferred imaging location?    Answer:   [facility]   DG Lumbar Spine 2-3 Views    Standing Status:   Future    Number of Occurrences:   1    Standing Expiration Date:   08/21/2022    Order Specific Question:   Reason for Exam (SYMPTOM  OR DIAGNOSIS REQUIRED)    Answer:   low back pain    Order Specific Question:  Is patient pregnant?    Answer:   No    Order Specific Question:   Preferred imaging location?    Answer:   [facility]   Ambulatory referral to Physical Therapy    Referral Priority:   Routine    Referral Type:   Physical Medicine    Referral Reason:   Specialty Services Required    Requested Specialty:   Physical Therapy    Number of Visits Requested:   1   No orders of the defined types were placed in this encounter.    Discussed warning signs or symptoms. Please see discharge instructions. Patient expresses understanding.   The above documentation has been reviewed and is accurate and complete Lynne Leader, M.D.

## 2021-08-21 NOTE — Patient Instructions (Addendum)
Good to see you  Please get an Xray today before you leave  Pt referral [facility] [place]  6 weeks

## 2021-08-23 ENCOUNTER — Encounter: Payer: Self-pay | Admitting: Family Medicine

## 2021-08-23 NOTE — Progress Notes (Signed)
X-ray lumbar spine looks largely normal to radiology.  No significant arthritis is present.  Pain very likely due to muscle dysfunction and spasm.  Plan for PT.

## 2021-08-23 NOTE — Progress Notes (Signed)
Left hip x-ray shows " minuscule" amounts of arthritis.

## 2021-09-16 DIAGNOSIS — M5451 Vertebrogenic low back pain: Secondary | ICD-10-CM | POA: Diagnosis not present

## 2021-09-16 DIAGNOSIS — M25551 Pain in right hip: Secondary | ICD-10-CM | POA: Diagnosis not present

## 2021-09-26 DIAGNOSIS — M5451 Vertebrogenic low back pain: Secondary | ICD-10-CM | POA: Diagnosis not present

## 2021-09-26 DIAGNOSIS — M25551 Pain in right hip: Secondary | ICD-10-CM | POA: Diagnosis not present

## 2021-10-01 DIAGNOSIS — M25551 Pain in right hip: Secondary | ICD-10-CM | POA: Diagnosis not present

## 2021-10-01 DIAGNOSIS — M5451 Vertebrogenic low back pain: Secondary | ICD-10-CM | POA: Diagnosis not present

## 2021-10-02 ENCOUNTER — Ambulatory Visit
Admission: RE | Admit: 2021-10-02 | Discharge: 2021-10-02 | Disposition: A | Discharge status: Home / Self Care | Payer: BC Managed Care – PPO | Source: Ambulatory Visit | Attending: Internal Medicine | Admitting: Internal Medicine

## 2021-10-02 DIAGNOSIS — R109 Unspecified abdominal pain: Secondary | ICD-10-CM | POA: Insufficient documentation

## 2021-10-06 ENCOUNTER — Encounter: Payer: Self-pay | Admitting: Internal Medicine

## 2021-10-14 ENCOUNTER — Other Ambulatory Visit: Payer: BC Managed Care – PPO

## 2021-10-15 DIAGNOSIS — M25551 Pain in right hip: Secondary | ICD-10-CM | POA: Diagnosis not present

## 2021-10-15 DIAGNOSIS — M5451 Vertebrogenic low back pain: Secondary | ICD-10-CM | POA: Diagnosis not present

## 2021-10-15 NOTE — Progress Notes (Deleted)
? ?  I, Wendy Poet, LAT, ATC, am serving as scribe for Dr. Lynne Leader. ? ?Ashley Barajas is a 41 y.o. 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  She was last seen by Dr. Georgina Snell on 08/21/21 and was advised to con't Flexeril at bedtime and to use heat and TENs.  She was also referred to [facility] in [place].  Today, pt reports  ? ?Diagnostic testing: L-spine XR and L hip XR- 08/21/21 ? ?Pertinent review of systems: *** ? ?Relevant historical information: *** ? ? ?Exam:  ?There were no vitals taken for this visit. ?General: Well Developed, well nourished, and in no acute distress.  ? ?MSK: *** ? ? ? ?Lab and Radiology Results ?No results found for this or any previous visit (from the past 72 hour(s)). ?No results found. ? ? ? ? ?Assessment and Plan: ?41 y.o. female with *** ? ? ?PDMP not reviewed this encounter. ?No orders of the defined types were placed in this encounter. ? ?No orders of the defined types were placed in this encounter. ? ? ? ?Discussed warning signs or symptoms. Please see discharge instructions. Patient expresses understanding. ? ? ?*** ? ?

## 2021-10-16 ENCOUNTER — Ambulatory Visit: Payer: BC Managed Care – PPO | Admitting: Family Medicine

## 2022-03-07 ENCOUNTER — Other Ambulatory Visit: Payer: Self-pay

## 2022-03-07 ENCOUNTER — Other Ambulatory Visit: Payer: Self-pay | Admitting: Internal Medicine

## 2022-03-07 MED ORDER — RIZATRIPTAN BENZOATE 10 MG PO TABS
10.0000 mg | ORAL_TABLET | ORAL | 8 refills | Status: AC | PRN
  Filled 2022-03-07: qty 10, 17d supply, fill #0

## 2022-04-28 ENCOUNTER — Encounter: Payer: Self-pay | Admitting: Internal Medicine

## 2022-04-28 NOTE — Patient Instructions (Signed)
      Blood work was ordered.   Have this done downstairs.    Medications changes include :   none    A referral was ordered for sports medicine.     Someone will call you to schedule an appointment.    Return if symptoms worsen or fail to improve.

## 2022-04-28 NOTE — Progress Notes (Unsigned)
    Subjective:    Patient ID: Ashley Barajas, female    DOB: Apr 06, 1981, 41 y.o.   MRN: [id number]      HPI Ashley Barajas is here for No chief complaint on file.    Stomach pain, fatigue, hair falling out   Hair loss       Medications and allergies reviewed with patient and updated if appropriate.  Current Outpatient Medications on File Prior to Visit  Medication Sig Dispense Refill   azelastine (ASTELIN) 0.1 % nasal spray Place 1 spray into both nostrils 2 (two) times daily. Use in each nostril as directed 30 mL 0   buPROPion (WELLBUTRIN XL) 150 MG 24 hr tablet TAKE 1 TABLET BY MOUTH DAILY. 90 tablet 1   cyclobenzaprine (FLEXERIL) 10 MG tablet Take 1 tablet (10 mg total) by mouth 2 (two) times daily as needed for muscle spasms. 20 tablet 0   cyclobenzaprine (FLEXERIL) 5 MG tablet Take ONE tab PO BID/TID PRN 15 tablet 0   fexofenadine (ALLEGRA) 180 MG tablet Take 180 mg by mouth daily as needed for allergies or rhinitis.     levonorgestrel (MIRENA, 52 MG,) 20 MCG/24HR IUD Mirena     meloxicam (MOBIC) 15 MG tablet TAKE 1 TABLET BY MOUTH DAILY. 30 tablet 0   methylPREDNISolone (MEDROL) 4 MG TBPK tablet Take as instructed for 6 days 21 tablet 0   rizatriptan (MAXALT) 10 MG tablet Take 1 tablet (10 mg total) by mouth as needed for migraine. May repeat in 2 hours if needed 10 tablet 8   No current facility-administered medications on file prior to visit.    Review of Systems     Objective:  There were no vitals filed for this visit. BP Readings from Last 3 Encounters:  08/21/21 110/80  08/20/21 121/78  08/15/21 102/78   Wt Readings from Last 3 Encounters:  08/21/21 131 lb (59.4 kg)  08/15/21 129 lb 9.6 oz (58.8 kg)  07/19/20 130 lb (59 kg)   There is no height or weight on file to calculate BMI.    Physical Exam         Assessment & Plan:    See Problem List for Assessment and Plan of chronic medical problems.

## 2022-04-29 ENCOUNTER — Ambulatory Visit: Payer: BC Managed Care – PPO | Admitting: Internal Medicine

## 2022-04-29 VITALS — BP 106/68 | HR 68 | Temp 98.3°F | Ht 63.0 in | Wt 134.0 lb

## 2022-04-29 DIAGNOSIS — L659 Nonscarring hair loss, unspecified: Secondary | ICD-10-CM | POA: Diagnosis not present

## 2022-04-29 DIAGNOSIS — R5382 Chronic fatigue, unspecified: Secondary | ICD-10-CM | POA: Diagnosis not present

## 2022-04-29 DIAGNOSIS — F3289 Other specified depressive episodes: Secondary | ICD-10-CM

## 2022-04-29 DIAGNOSIS — R109 Unspecified abdominal pain: Secondary | ICD-10-CM | POA: Diagnosis not present

## 2022-04-29 DIAGNOSIS — D696 Thrombocytopenia, unspecified: Secondary | ICD-10-CM

## 2022-04-29 DIAGNOSIS — G43009 Migraine without aura, not intractable, without status migrainosus: Secondary | ICD-10-CM

## 2022-04-29 LAB — COMPREHENSIVE METABOLIC PANEL
ALT: 20 U/L (ref 0–35)
AST: 22 U/L (ref 0–37)
Albumin: 4.6 g/dL (ref 3.5–5.2)
Alkaline Phosphatase: 36 U/L — ABNORMAL LOW (ref 39–117)
BUN: 21 mg/dL (ref 6–23)
CO2: 27 mEq/L (ref 19–32)
Calcium: 9.7 mg/dL (ref 8.4–10.5)
Chloride: 102 mEq/L (ref 96–112)
Creatinine, Ser: 0.8 mg/dL (ref 0.40–1.20)
GFR: 92.87 mL/min (ref >60.00)
Glucose, Bld: 89 mg/dL (ref 70–99)
Potassium: 3.9 mEq/L (ref 3.5–5.1)
Sodium: 135 mEq/L (ref 135–145)
Total Bilirubin: 0.6 mg/dL (ref 0.2–1.2)
Total Protein: 7.7 g/dL (ref 6.0–8.3)

## 2022-04-29 LAB — VITAMIN B12: Vitamin B-12: 720 pg/mL (ref 211–911)

## 2022-04-29 LAB — TSH: TSH: 1.09 u[IU]/mL (ref 0.35–5.50)

## 2022-04-29 LAB — T4, FREE: Free T4: 0.8 ng/dL (ref 0.60–1.60)

## 2022-04-29 LAB — T3, FREE: T3, Free: 3.2 pg/mL (ref 2.3–4.2)

## 2022-04-29 NOTE — Assessment & Plan Note (Signed)
Acute Has been seeing a lot of hair loss especially when she washes her hair No discrete areas of hair loss on exam Check TFTs, B12, CBC, CMP

## 2022-04-29 NOTE — Assessment & Plan Note (Signed)
Acute on chronic Has chronic fatigue, but worse the past couple of weeks without obvious reason why She is working and exercising on a regular basis Sleep is not refreshing, but previous sleep study was negative for sleep apnea Taking a multivitamin Exercising regularly Denies depression Check CBC, CMP, TSH, free T4, free T3 and B12 level

## 2022-04-29 NOTE — Assessment & Plan Note (Signed)
Chronic Occasional migraines Continue Maxalt 10 mg as needed

## 2022-04-29 NOTE — Assessment & Plan Note (Addendum)
Chronic Has been going on for at least a year Has left-sided abdominal pain that is intermittent and seems to be sporadic Pain is worse after abdominal exercises No GI, GU symptoms Likely muscular in nature Abdominal ultrasound normal Discussed possible CT scan versus seen sports medicine to see if they can confirm that this is muscular Given lack of other symptoms and duration of pain and clear association with abdominal exercises we will hold off on CT scan and see with sports medicine thinks

## 2022-04-29 NOTE — Assessment & Plan Note (Signed)
Denies depression right now Not currently taking Wellbutrin, but may restart for the winter if she has some seasonal depression

## 2022-04-30 ENCOUNTER — Encounter: Payer: Self-pay | Admitting: Family Medicine

## 2022-04-30 ENCOUNTER — Ambulatory Visit: Payer: BC Managed Care – PPO | Admitting: Family Medicine

## 2022-04-30 ENCOUNTER — Ambulatory Visit: Payer: Self-pay

## 2022-04-30 ENCOUNTER — Encounter: Payer: Self-pay | Admitting: Internal Medicine

## 2022-04-30 VITALS — BP 118/80 | HR 64 | Ht 63.0 in | Wt 136.0 lb

## 2022-04-30 DIAGNOSIS — M2550 Pain in unspecified joint: Secondary | ICD-10-CM

## 2022-04-30 DIAGNOSIS — D693 Immune thrombocytopenic purpura: Secondary | ICD-10-CM

## 2022-04-30 DIAGNOSIS — R109 Unspecified abdominal pain: Secondary | ICD-10-CM

## 2022-04-30 LAB — CBC WITH DIFFERENTIAL/PLATELET
Basophils Absolute: 0 10*3/uL (ref 0.0–0.1)
Basophils Relative: 0.7 % (ref 0.0–3.0)
Eosinophils Absolute: 0.1 10*3/uL (ref 0.0–0.7)
Eosinophils Relative: 2.1 % (ref 0.0–5.0)
HCT: 34.8 % — ABNORMAL LOW (ref 36.0–46.0)
Hemoglobin: 11.9 g/dL — ABNORMAL LOW (ref 12.0–15.0)
Lymphocytes Relative: 42.3 % (ref 12.0–46.0)
Lymphs Abs: 1.9 10*3/uL (ref 0.7–4.0)
MCHC: 34 g/dL (ref 30.0–36.0)
MCV: 87.4 fl (ref 78.0–100.0)
Monocytes Absolute: 0.3 10*3/uL (ref 0.1–1.0)
Monocytes Relative: 7.3 % (ref 3.0–12.0)
Neutro Abs: 2.1 10*3/uL (ref 1.4–7.7)
Neutrophils Relative %: 47.6 % (ref 43.0–77.0)
Platelets: 126 10*3/uL — ABNORMAL LOW (ref 150.0–400.0)
RBC: 3.98 Mil/uL (ref 3.87–5.11)
RDW: 13.2 % (ref 11.5–15.5)
WBC: 4.5 10*3/uL (ref 4.0–10.5)

## 2022-04-30 LAB — SEDIMENTATION RATE: Sed Rate: 7 mm/hr (ref 0–20)

## 2022-04-30 LAB — IBC PANEL
Iron: 137 ug/dL (ref 42–145)
Saturation Ratios: 49.9 % (ref 20.0–50.0)
TIBC: 274.4 ug/dL (ref 250.0–450.0)
Transferrin: 196 mg/dL — ABNORMAL LOW (ref 212.0–360.0)

## 2022-04-30 LAB — VITAMIN D 25 HYDROXY (VIT D DEFICIENCY, FRACTURES): VITD: 62.04 ng/mL (ref 30.00–100.00)

## 2022-04-30 LAB — URIC ACID: Uric Acid, Serum: 5.1 mg/dL (ref 2.4–7.0)

## 2022-04-30 NOTE — Addendum Note (Signed)
Addended by: Binnie Rail on: 04/30/2022 09:10 PM   Modules accepted: Orders

## 2022-04-30 NOTE — Progress Notes (Signed)
Ashley Barajas [address] [place] Phone: [phone] Subjective:   IVilma Barajas, am serving as a scribe for Dr. Hulan Saas.  I'm seeing this patient by the request  of:  Binnie Rail, MD  CC: Left-sided abdominal pain  HGD:[id number]  Ashley Barajas is a 41 y.o. female coming in with complaint of L sided abdominal pain. Patient last seen in 2022 for hamstring injury. Patient states having LLQ pain on and off over the last year. Sometimes described as a throbbing or burning pain. Has been getting better recently. Patient states that this does seem to be associated significantly with more fatigue recently.  Has had difficulty with this.  Starting to affect daily activities.  Affecting work somewhat.      Past Medical History:  Diagnosis Date   Amenorrhea, unspecified    states no menses in 9 yrs.   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   right   Migraines    Past Surgical History:  Procedure Laterality Date   MELANOMA EXCISION Right 10/30/2016   Procedure: WIDE LOCAL EXCISION AND ADVANCEMENT FLAP CLOSURE DEFECT RIGHT BUTTOCK;  Surgeon: Stark Klein, MD;  Location: [facility];  Service: General;  Laterality: Right;   NO PAST SURGERIES     Social History   Socioeconomic History   Marital status: Married    Spouse name: Not on file   Number of children: 2   Years of education: Not on file   Highest education level: Not on file  Occupational History   Not on file  Tobacco Use   Smoking status: Former    Types: Cigarettes    Quit date: 07/13/2005    Years since quitting: 16.8   Smokeless tobacco: Never  Vaping Use   Vaping Use: Never used  Substance and Sexual Activity   Alcohol use: No   Drug use: No   Sexual activity: Not on file  Other Topics Concern   Not on file  Social History Narrative   Nurse - long term care      Two  children: 6,11   Social Determinants of Health   Financial Resource Strain: Not on file  Food Insecurity: Not on file  Transportation Needs: Not on file  Physical Activity: Not on file  Stress: Not on file  Social Connections: Not on file   Allergies  Allergen Reactions   Adhesive [Tape] Rash   Erythromycin Other (See Comments)    UNKNOWN - WAS AS A CHILD   Family History  Problem Relation Age of Onset   Hypertension Mother    Hypertension Father    Cancer Son        leukemia    Current Outpatient Medications (Endocrine & Metabolic):    levonorgestrel (MIRENA, 52 MG,) 20 MCG/24HR IUD, Mirena   Current Outpatient Medications (Respiratory):    fexofenadine (ALLEGRA) 180 MG tablet, Take 180 mg by mouth daily as needed for allergies or rhinitis.  Current Outpatient Medications (Analgesics):    rizatriptan (MAXALT) 10 MG tablet, Take 1 tablet (10 mg total) by mouth as needed for migraine. May repeat in 2 hours if needed   Current Outpatient Medications (Other):    buPROPion (WELLBUTRIN XL) 150 MG 24 hr tablet, TAKE 1 TABLET BY MOUTH DAILY.   Multiple Vitamin (MULTIVITAMIN) capsule, Take 1 capsule by mouth daily.   Reviewed prior external information  including notes and imaging from  primary care provider As well as notes that were available from care everywhere and other healthcare systems.  Past medical history, social, surgical and family history all reviewed in electronic medical record.  No pertanent information unless stated regarding to the chief complaint.   Review of Systems:  No headache, visual changes, nausea, vomiting, diarrhea, constipation, dizziness, abdominal pain, skin rash, fevers, chills, night sweats, weight loss, swollen lymph nodes, body aches, joint swelling, chest pain, shortness of breath, mood changes. POSITIVE muscle aches  Objective  Blood pressure 118/80, pulse 64, height '5\' 3"'$  (1.6 m), weight 136 lb (61.7 kg), SpO2 99 %.   General: No  apparent distress alert and oriented x3 mood and affect normal, dressed appropriately.  HEENT: Pupils equal, extraocular movements intact  Respiratory: Patient's speak in full sentences and does not appear short of breath  Cardiovascular: No lower extremity edema, non tender, no erythema  Patient's left hip very mild tenderness to palpation over the greater trochanteric area.  Negative straight leg test.  Patient does have some pain minorly in the abdominal area left lower quadrant.  Limited muscular skeletal ultrasound was performed and interpreted by Hulan Saas, M  Limited ultrasound shows the patient does have calcific changes it appears in the colon itself.  Seems to be going along the wall more than anything else. No significant breakdown of any other abdominal areas.  No vascularity compromise noted in the area. Impression: Nonspecific calcific deposits in the colon it appears     Impression and Recommendations:    The above documentation has been reviewed and is accurate and complete Lyndal Pulley, DO

## 2022-04-30 NOTE — Assessment & Plan Note (Signed)
Patient does have some calcific changes noted of the abdominal area.  This does seem to be with in the colon aspect.  We discussed with patient icing regimen.  We discussed stool softeners in addition to her MiraLAX at the moment.  Seems to be more calcific changes and will get laboratory work-up to make sure nothing else is contributing.  Patient has had more of fatigue as well.  We will see if the calcium could be potentially contributing.  Vitamin D will be checked as well.  Patient does have worsening pain to seek medical attention immediately.  Follow-up with me again in 6 to 8 weeks

## 2022-05-02 ENCOUNTER — Encounter: Payer: Self-pay | Admitting: Family Medicine

## 2022-05-02 LAB — PTH, INTACT AND CALCIUM
Calcium: 9.1 mg/dL (ref 8.6–10.2)
PTH: 17 pg/mL (ref 16–77)

## 2022-05-23 ENCOUNTER — Other Ambulatory Visit: Payer: Self-pay

## 2022-05-23 MED ORDER — METHYLPREDNISOLONE 4 MG PO TBPK
ORAL_TABLET | ORAL | 0 refills | Status: AC
  Filled 2022-05-23: qty 21, 6d supply, fill #0

## 2022-05-23 MED ORDER — CYCLOBENZAPRINE HCL 5 MG PO TABS
5.0000 mg | ORAL_TABLET | Freq: Every evening | ORAL | 0 refills | Status: AC | PRN
  Filled 2022-05-23: qty 21, 21d supply, fill #0

## 2022-05-26 DIAGNOSIS — Z1151 Encounter for screening for human papillomavirus (HPV): Secondary | ICD-10-CM | POA: Diagnosis not present

## 2022-05-26 DIAGNOSIS — Z6823 Body mass index (BMI) 23.0-23.9, adult: Secondary | ICD-10-CM | POA: Diagnosis not present

## 2022-05-26 DIAGNOSIS — Z124 Encounter for screening for malignant neoplasm of cervix: Secondary | ICD-10-CM | POA: Diagnosis not present

## 2022-05-26 DIAGNOSIS — Z01419 Encounter for gynecological examination (general) (routine) without abnormal findings: Secondary | ICD-10-CM | POA: Diagnosis not present

## 2022-05-27 ENCOUNTER — Other Ambulatory Visit: Payer: Self-pay | Admitting: Internal Medicine

## 2022-05-27 DIAGNOSIS — D539 Nutritional anemia, unspecified: Secondary | ICD-10-CM

## 2022-05-28 ENCOUNTER — Inpatient Hospital Stay: Payer: BC Managed Care – PPO | Admitting: Internal Medicine

## 2022-05-28 ENCOUNTER — Inpatient Hospital Stay: Payer: BC Managed Care – PPO

## 2022-06-04 NOTE — Progress Notes (Deleted)
[place] Phone: [phone] Subjective:    I'm seeing this patient by the request  of:  Binnie Rail, MD  CC: Left-sided abdominal pain follow-up  [id number]  04/30/2022 Patient does have some calcific changes noted of the abdominal area.  This does seem to be with in the colon aspect.  We discussed with patient icing regimen.  We discussed stool softeners in addition to her MiraLAX at the moment.  Seems to be more calcific changes and will get laboratory work-up to make sure nothing else is contributing.  Patient has had more of fatigue as well.  We will see if the calcium could be potentially contributing.  Vitamin D will be checked as well.  Patient does have worsening pain to seek medical attention immediately.  Follow-up with me again in 6 to 8 weeks     Update 06/10/2022 Ashley Barajas is a 40 y.o. female coming in with complaint of LUQ pain.  Patient previously did have what appeared to be calcific changes noted in the abdominal area.  Patient did have laboratory workup that did show patient was having some mild thrombocytopenia as well as anemia.  Otherwise laboratory workup was fairly unremarkable.  Patient has prescription of cyclobenzaprine 5 mg.  Patient states        Past Medical History:  Diagnosis Date   Amenorrhea, unspecified    states no menses in 9 yrs.   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   Migraines    Past Surgical History:  Procedure Laterality Date   MELANOMA EXCISION Right 10/30/2016   Procedure: WIDE LOCAL EXCISION AND ADVANCEMENT FLAP CLOSURE DEFECT RIGHT BUTTOCK;  Surgeon: Stark Klein, MD;  Location: [facility];  Service: General;  Laterality: Right;   NO PAST SURGERIES     Social History   Socioeconomic History   Marital status: Married    Spouse name: Not on file   Number of  children: 2   Years of education: Not on file   Highest education level: Not on file  Occupational History   Not on file  Tobacco Use   Smoking status: Former    Types: Cigarettes    Quit date: 07/13/2005    Years since quitting: 16.9   Smokeless tobacco: Never  Vaping Use   Vaping Use: Never used  Substance and Sexual Activity   Alcohol use: No   Drug use: No   Sexual activity: Not on file  Other Topics Concern   Not on file  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 PAIN, JOINT PAIN, FATIGUE, CHRONIC CONSTIPATION   Family History  Problem Relation Age of Onset   Hypertension Mother    Hypertension Father    Cancer Son        leukemia  Current Outpatient Medications (Endocrine & Metabolic):    levonorgestrel (MIRENA, 52 MG,) 20 MCG/24HR IUD, Mirena   Current Outpatient Medications (Respiratory):    fexofenadine (ALLEGRA) 180 MG tablet, Take 180 mg by mouth daily as needed for allergies or rhinitis.  Current Outpatient Medications (Analgesics):    rizatriptan (MAXALT) 10 MG tablet, Take 1 tablet (10 mg total) by mouth as needed for migraine. May repeat in 2 hours if needed   Current Outpatient Medications (Other):    buPROPion (WELLBUTRIN XL) 150 MG 24 hr tablet, TAKE 1 TABLET BY MOUTH DAILY.   cyclobenzaprine (FLEXERIL) 5 MG tablet, Take 1 tablet (5 mg total) by mouth at bedtime as needed. Do not take more than 3 weeks.   Multiple Vitamin (MULTIVITAMIN) capsule, Take 1 capsule by mouth daily.   Reviewed prior  external information including notes and imaging from  primary care provider As well as notes that were available from care everywhere and other healthcare systems.  Past medical history, social, surgical and family history all reviewed in electronic medical record.  No pertanent information unless stated regarding to the chief complaint.   Review of Systems:  No headache, visual changes, nausea, vomiting, diarrhea, constipation, dizziness,  skin rash, fevers, chills, night sweats, weight loss, swollen lymph nodes, body aches, joint swelling, chest pain, shortness of breath, mood changes. POSITIVE muscle aches, abdominal pain  Objective  There were no vitals taken for this visit.   General: No apparent distress alert and oriented x3 mood and affect normal, dressed appropriately.  HEENT: Pupils equal, extraocular movements intact  Respiratory: Patient's speak in full sentences and does not appear short of breath  Cardiovascular: No lower extremity edema, non tender, no erythema  Abdominal exam shows    Impression and Recommendations:     The above documentation has been reviewed and is accurate and complete Lyndal Pulley, DO

## 2022-06-09 DIAGNOSIS — Z30431 Encounter for routine checking of intrauterine contraceptive device: Secondary | ICD-10-CM | POA: Diagnosis not present

## 2022-06-10 ENCOUNTER — Ambulatory Visit: Payer: BC Managed Care – PPO | Admitting: Family Medicine

## 2022-06-23 DIAGNOSIS — Z1231 Encounter for screening mammogram for malignant neoplasm of breast: Secondary | ICD-10-CM | POA: Diagnosis not present

## 2022-06-25 NOTE — Progress Notes (Signed)
[facility] [place] Phone: [phone] Subjective:   Ashley Barajas, am serving as a scribe for Dr. Hulan Saas.  I'm seeing this patient by the request  of:  Binnie Rail, MD  CC: Left-sided abdominal pain follow-up  [id number]  04/30/2022 Patient does have some calcific changes noted of the abdominal area.  This does seem to be with in the colon aspect.  We discussed with patient icing regimen.  We discussed stool softeners in addition to her MiraLAX at the moment.  Seems to be more calcific changes and will get laboratory work-up to make sure nothing else is contributing.  Patient has had more of fatigue as well.  We will see if the calcium could be potentially contributing.  Vitamin D will be checked as well.  Patient does have worsening pain to seek medical attention immediately.  Follow-up with me again in 6 to 8 weeks     Update 06/30/2022 Ashley Barajas is a 41 y.o. female coming in with complaint of L sided abdominal pain. Patient states that the abdominal pain has improved but still is uncomfortable at times.       Past Medical History:  Diagnosis Date   Amenorrhea, unspecified    states no menses in 9 yrs.   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   Migraines    Past Surgical History:  Procedure Laterality Date   MELANOMA EXCISION Right 10/30/2016   Procedure: WIDE LOCAL EXCISION AND ADVANCEMENT FLAP CLOSURE DEFECT RIGHT BUTTOCK;  Surgeon: Stark Klein, MD;  Location: [facility];  Service: General;  Laterality: Right;   NO PAST SURGERIES     Social History   Socioeconomic History   Marital status: Married    Spouse name: Not on file   Number of children: 2   Years of education: Not on file   Highest education level: Not on file  Occupational History   Not on file  Tobacco Use   Smoking status:  Former    Types: Cigarettes    Quit date: 07/13/2005    Years since quitting: 16.9   Smokeless tobacco: Never  Vaping Use   Vaping Use: Never used  Substance and Sexual Activity   Alcohol use: No   Drug use: No   Sexual activity: Not on file  Other Topics Concern   Not on file  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 PAIN, JOINT PAIN, FATIGUE, CHRONIC CONSTIPATION   Family History  Problem Relation Age of Onset   Hypertension Mother    Hypertension Father    Cancer Son        leukemia    Current Outpatient Medications (Endocrine & Metabolic):    levonorgestrel (MIRENA, 52 MG,) 20 MCG/24HR IUD, Mirena   Current Outpatient Medications (Respiratory):  fexofenadine (ALLEGRA) 180 MG tablet, Take 180 mg by mouth daily as needed for allergies or rhinitis.  Current Outpatient Medications (Analgesics):    rizatriptan (MAXALT) 10 MG tablet, Take 1 tablet (10 mg total) by mouth as needed for migraine. May repeat in 2 hours if needed   Current Outpatient Medications (Other):    Vitamin D, Ergocalciferol, (DRISDOL) 1.25 MG (50000 UNIT) CAPS capsule, Take 1 capsule (50,000 Units total) by mouth every 7 (seven) days.   buPROPion (WELLBUTRIN XL) 150 MG 24 hr tablet, TAKE 1 TABLET BY MOUTH DAILY.   cyclobenzaprine (FLEXERIL) 5 MG tablet, Take 1 tablet (5 mg total) by mouth at bedtime as needed. Do not take more than 3 weeks.   Multiple Vitamin (MULTIVITAMIN) capsule, Take 1 capsule by mouth daily.   Reviewed prior external  information including notes and imaging from  primary care provider As well as notes that were available from care everywhere and other healthcare systems.  Past medical history, social, surgical and family history all reviewed in electronic medical record.  No pertanent information unless stated regarding to the chief complaint.   Review of Systems:  No headache, visual changes, nausea, vomiting, diarrhea, constipation, dizziness,  skin rash, fevers, chills, night sweats, weight loss, swollen lymph nodes, body aches, joint swelling, chest pain, shortness of breath, mood changes. POSITIVE muscle aches, abdominal pain but improving  Objective  Blood pressure 122/60, pulse 74, height '5\' 3"'$  (1.6 m), SpO2 99 %.   General: No apparent distress alert and oriented x3 mood and affect normal, dressed appropriately.  HEENT: Pupils equal, extraocular movements intact  Respiratory: Patient's speak in full sentences and does not appear short of breath  Cardiovascular: No lower extremity edema, non tender, no erythema  Left-sided abdominal pain has some very mild tenderness noted in the left lower quadrant.  Significant improvement from previous exam.  No masses appreciated.  Limited muscular skeletal ultrasound was performed and interpreted by Hulan Saas, M  Limited ultrasound shows significant decrease in the calcific changes in the muscular area as well as in the gut itself.  Seems to have improvement also in the calcific changes in the peristalsis of the gut in this area. Impression: Interval improvement    Impression and Recommendations:     The above documentation has been reviewed and is accurate and complete Lyndal Pulley, DO

## 2022-06-30 ENCOUNTER — Ambulatory Visit: Payer: Self-pay

## 2022-06-30 ENCOUNTER — Encounter: Payer: Self-pay | Admitting: Family Medicine

## 2022-06-30 ENCOUNTER — Other Ambulatory Visit: Payer: Self-pay

## 2022-06-30 ENCOUNTER — Ambulatory Visit (INDEPENDENT_AMBULATORY_CARE_PROVIDER_SITE_OTHER): Payer: BC Managed Care – PPO | Admitting: Family Medicine

## 2022-06-30 VITALS — BP 122/60 | HR 74 | Ht 63.0 in

## 2022-06-30 DIAGNOSIS — D696 Thrombocytopenia, unspecified: Secondary | ICD-10-CM

## 2022-06-30 DIAGNOSIS — R109 Unspecified abdominal pain: Secondary | ICD-10-CM | POA: Diagnosis not present

## 2022-06-30 LAB — CBC WITH DIFFERENTIAL/PLATELET
Basophils Absolute: 0 10*3/uL (ref 0.0–0.1)
Basophils Relative: 0.4 % (ref 0.0–3.0)
Eosinophils Absolute: 0.1 10*3/uL (ref 0.0–0.7)
Eosinophils Relative: 1.2 % (ref 0.0–5.0)
HCT: 37.9 % (ref 36.0–46.0)
Hemoglobin: 13.1 g/dL (ref 12.0–15.0)
Lymphocytes Relative: 27.6 % (ref 12.0–46.0)
Lymphs Abs: 2 10*3/uL (ref 0.7–4.0)
MCHC: 34.5 g/dL (ref 30.0–36.0)
MCV: 87.4 fl (ref 78.0–100.0)
Monocytes Absolute: 0.4 10*3/uL (ref 0.1–1.0)
Monocytes Relative: 6.1 % (ref 3.0–12.0)
Neutro Abs: 4.7 10*3/uL (ref 1.4–7.7)
Neutrophils Relative %: 64.7 % (ref 43.0–77.0)
Platelets: 144 10*3/uL — ABNORMAL LOW (ref 150.0–400.0)
RBC: 4.33 Mil/uL (ref 3.87–5.11)
RDW: 13.5 % (ref 11.5–15.5)
WBC: 7.3 10*3/uL (ref 4.0–10.5)

## 2022-06-30 MED ORDER — VITAMIN D (ERGOCALCIFEROL) 1.25 MG (50000 UNIT) PO CAPS
50000.0000 [IU] | ORAL_CAPSULE | ORAL | 1 refills | Status: DC
  Filled 2022-06-30: qty 12, 84d supply, fill #0

## 2022-06-30 NOTE — Assessment & Plan Note (Signed)
Patient on ultrasound does seem to have a decrease in the amount of calcific changes noted.  Discussed with patient rule out other things to do.  Will do a once weekly vitamin D for 8 weeks just to see if this will be beneficial.  Continue to monitor.  Follow-up with me in 2 months to make sure it completely resolved

## 2022-06-30 NOTE — Patient Instructions (Signed)
Good to see you  1 weekly vit D for 3 weeks sent in to your pharmacy  Everything is improving significantly  Continue multi vitamin with zinc See me again in 2 months if not completely better

## 2022-08-28 NOTE — Progress Notes (Deleted)
[place] Phone: [phone] Subjective:    I'm seeing this patient by the request  of:  Binnie Rail, MD  CC:   [id number]  06/30/2022 Patient on ultrasound does seem to have a decrease in the amount of calcific changes noted.  Discussed with patient rule out other things to do.  Will do a once weekly vitamin D for 8 weeks just to see if this will be beneficial.  Continue to monitor.  Follow-up with me in 2 months to make sure it completely resolved      Update 09/02/2022 Ashley Barajas is a 42 y.o. female coming in with complaint of L side abdominal pain. Patient states      Past Medical History:  Diagnosis Date   Amenorrhea, unspecified    states no menses in 9 yrs.   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   Migraines    Past Surgical History:  Procedure Laterality Date   MELANOMA EXCISION Right 10/30/2016   Procedure: WIDE LOCAL EXCISION AND ADVANCEMENT FLAP CLOSURE DEFECT RIGHT BUTTOCK;  Surgeon: Stark Klein, MD;  Location: [facility];  Service: General;  Laterality: Right;   NO PAST SURGERIES     Social History   Socioeconomic History   Marital status: Married    Spouse name: Not on file   Number of children: 2   Years of education: Not on file   Highest education level: Not on file  Occupational History   Not on file  Tobacco Use   Smoking status: Former    Types: Cigarettes    Quit date: 07/13/2005    Years since quitting: 17.1   Smokeless tobacco: Never  Vaping Use   Vaping Use: Never used  Substance and Sexual Activity   Alcohol use: No   Drug use: No   Sexual activity: Not on file  Other Topics Concern   Not on file  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 PAIN, JOINT PAIN, FATIGUE, CHRONIC CONSTIPATION   Family History  Problem Relation Age of Onset   Hypertension Mother    Hypertension Father    Cancer Son        leukemia    Current Outpatient Medications (Endocrine & Metabolic):    levonorgestrel (MIRENA, 52 MG,) 20 MCG/24HR IUD, Mirena   Current Outpatient Medications (Respiratory):    fexofenadine (ALLEGRA) 180 MG tablet, Take 180 mg by mouth daily as needed for allergies or rhinitis.  Current Outpatient Medications (Analgesics):    rizatriptan (MAXALT) 10 MG tablet, Take 1 tablet (10 mg total) by mouth as needed for migraine. May repeat in 2 hours if needed   Current Outpatient Medications (Other):    buPROPion (WELLBUTRIN XL) 150 MG 24 hr tablet, TAKE 1 TABLET BY MOUTH DAILY.  cyclobenzaprine (FLEXERIL) 5 MG tablet, Take 1 tablet (5 mg total) by mouth at bedtime as needed. Do not take more than 3 weeks.   Multiple Vitamin (MULTIVITAMIN) capsule, Take 1 capsule by mouth daily.   Vitamin D, Ergocalciferol, (DRISDOL) 1.25 MG (50000 UNIT) CAPS capsule, Take 1 capsule (50,000 Units total) by mouth every 7 (seven) days.   Reviewed prior external information including notes and imaging from  primary care provider As well as notes that were available from care everywhere and other healthcare systems.  Past medical history, social, surgical and family history all reviewed in electronic medical record.  No pertanent information unless stated regarding to the chief complaint.   Review of Systems:  No headache, visual changes, nausea, vomiting, diarrhea, constipation, dizziness, abdominal pain,  skin rash, fevers, chills, night sweats, weight loss, swollen lymph nodes, body aches, joint swelling, chest pain, shortness of breath, mood changes. POSITIVE muscle aches  Objective  There were no vitals taken for this visit.   General: No apparent distress alert and oriented x3 mood and affect normal, dressed appropriately.  HEENT: Pupils equal, extraocular movements intact  Respiratory: Patient's speak in full sentences and does not appear short of breath  Cardiovascular: No lower extremity edema, non tender, no erythema      Impression and Recommendations:

## 2022-09-02 ENCOUNTER — Ambulatory Visit: Payer: BC Managed Care – PPO | Admitting: Family Medicine

## 2022-10-29 ENCOUNTER — Ambulatory Visit: Payer: BC Managed Care – PPO | Admitting: Family Medicine

## 2022-10-29 ENCOUNTER — Other Ambulatory Visit: Payer: Self-pay

## 2022-10-29 VITALS — BP 116/80 | HR 64 | Ht 63.0 in | Wt 134.0 lb

## 2022-10-29 DIAGNOSIS — R109 Unspecified abdominal pain: Secondary | ICD-10-CM | POA: Diagnosis not present

## 2022-10-29 DIAGNOSIS — M9904 Segmental and somatic dysfunction of sacral region: Secondary | ICD-10-CM

## 2022-10-29 DIAGNOSIS — M9902 Segmental and somatic dysfunction of thoracic region: Secondary | ICD-10-CM | POA: Diagnosis not present

## 2022-10-29 DIAGNOSIS — M9903 Segmental and somatic dysfunction of lumbar region: Secondary | ICD-10-CM

## 2022-10-29 DIAGNOSIS — M545 Low back pain, unspecified: Secondary | ICD-10-CM

## 2022-10-29 MED ORDER — VITAMIN D (ERGOCALCIFEROL) 1.25 MG (50000 UNIT) PO CAPS
50000.0000 [IU] | ORAL_CAPSULE | ORAL | 0 refills | Status: AC
  Filled 2022-10-29 – 2022-12-23 (×2): qty 12, 84d supply, fill #0

## 2022-10-29 NOTE — Patient Instructions (Signed)
Vit D refilled Started manipulation See me again in 6 weeks

## 2022-10-29 NOTE — Progress Notes (Unsigned)
[facility] [address] [place] [address] Phone: [phone] Subjective:   Bruce Donath, am serving as a scribe for Dr. Antoine Primas.  I'm seeing this patient by the request  of:  Pincus Sanes, MD  CC: Atypical abdominal pain f/u  [id number]  ROLLANDE THURSBY is a 42 y.o. female coming in with complaint of LLQ pain and lumbar spine pain. Continues to have pain in this area that is unchanged from her last visit.   Patient also c/o lumbar spine pain. Denies any radiating symptoms. Using mm relaxer and IBU. Has pain that feels like her back wants to pop.      Past Medical History:  Diagnosis Date   Amenorrhea, unspecified    states no menses in 9 yrs.   Anemia    Constipation    ITP (idiopathic thrombocytopenic purpura)    Melanoma of buttock ([facility]) 10/2016   right   Migraines    Past Surgical History:  Procedure Laterality Date   MELANOMA EXCISION Right 10/30/2016   Procedure: WIDE LOCAL EXCISION AND ADVANCEMENT FLAP CLOSURE DEFECT RIGHT BUTTOCK;  Surgeon: Almond Lint, MD;  Location: [facility];  Service: General;  Laterality: Right;   NO PAST SURGERIES     Social History   Socioeconomic History   Marital status: Married    Spouse name: Not on file   Number of children: 2   Years of education: Not on file   Highest education level: Not on file  Occupational History   Not on file  Tobacco Use   Smoking status: Former    Types: Cigarettes    Quit date: 07/13/2005    Years since quitting: 17.3   Smokeless tobacco: Never  Vaping Use   Vaping Use: Never used  Substance and Sexual Activity   Alcohol use: No   Drug use: No   Sexual activity: Not on file  Other Topics Concern   Not on file  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 PAIN, JOINT PAIN, FATIGUE, CHRONIC CONSTIPATION   Family History  Problem Relation Age of Onset   Hypertension Mother    Hypertension Father    Cancer Son        leukemia    Current Outpatient Medications (Endocrine & Metabolic):    levonorgestrel (MIRENA, 52 MG,) 20 MCG/24HR IUD, Mirena   Current Outpatient Medications (Respiratory):    fexofenadine (ALLEGRA) 180 MG tablet, Take 180 mg by mouth daily as needed for allergies or rhinitis.  Current Outpatient Medications (Analgesics):    rizatriptan (MAXALT) 10 MG tablet, Take 1 tablet (10 mg total) by mouth as needed for migraine. May repeat in 2 hours if needed   Current Outpatient Medications (Other):    cyclobenzaprine (FLEXERIL) 5 MG tablet, Take 1 tablet (5 mg total) by mouth at bedtime as needed. Do not take more than 3 weeks.   Multiple Vitamin (MULTIVITAMIN) capsule,  Take 1 capsule by mouth daily.   Vitamin D, Ergocalciferol, (DRISDOL) 1.25 MG (50000 UNIT) CAPS capsule, Take 1 capsule (50,000 Units total) by mouth every 7 (seven) days.   Reviewed prior external information including notes and imaging from  primary care provider As well as notes that were available from care everywhere and other healthcare systems.  Past medical history, social, surgical and family history all reviewed in electronic medical record.  No pertanent information unless stated regarding to the chief complaint.   Review of Systems:  No headache, visual changes, nausea, vomiting, diarrhea, constipation, dizziness,  skin rash, fevers, chills, night sweats, weight loss, swollen lymph nodes, body aches, joint swelling, chest pain, shortness of breath, mood changes.  POSITIVE muscle aches, abdominal pain  Objective  Blood pressure 116/80, pulse 64, height  (1.6 m), weight 134 lb (60.8 kg), SpO2 99 %.   General: No apparent distress alert and oriented x3 mood and affect normal, dressed appropriately.  HEENT: Pupils equal, extraocular movements intact  Respiratory: Patient's speak in full sentences and does not appear short of breath  Cardiovascular: No lower extremity edema, non tender, no erythema  Left-sided abdominal pain seems to be more tightness in the hamstring and the flexor at the moment.  Patient does have some mild tenderness over the greater trochanteric area again.  Low back does have some loss lordosis noted.  Tenderness over the sacroiliac joint  Osteopathic findings T9 extended rotated and side bent left L2 flexed rotated and side bent right Sacrum right on right     Impression and Recommendations:     The above documentation has been reviewed and is accurate and complete Judi Saa, DO

## 2022-10-30 ENCOUNTER — Encounter: Payer: Self-pay | Admitting: Family Medicine

## 2022-10-30 DIAGNOSIS — M9904 Segmental and somatic dysfunction of sacral region: Secondary | ICD-10-CM | POA: Insufficient documentation

## 2022-10-30 NOTE — Assessment & Plan Note (Signed)
Attempted osteopathic manipulation today to see if this would help.  Patient did have some improvement in range of motion.  Discussed hip abductor strengthening exercises as well as stretching of the hip flexor.  Patient would like to see how patient does.  Increase activity slowly otherwise.  Follow-up with me again 6 to 8 weeks.

## 2022-10-30 NOTE — Assessment & Plan Note (Signed)
Patient has had intermittently low left abdominal pain that we will see if this is more of the hip flexor.  Aware given exercises today to see how patient responds.  If any more abdominal pain or other further evaluation with advanced imaging would be warranted but hopefully not.

## 2022-10-30 NOTE — Assessment & Plan Note (Signed)

## 2022-11-03 ENCOUNTER — Other Ambulatory Visit: Payer: Self-pay

## 2022-11-03 ENCOUNTER — Telehealth: Payer: BC Managed Care – PPO | Admitting: Physician Assistant

## 2022-11-03 DIAGNOSIS — B9689 Other specified bacterial agents as the cause of diseases classified elsewhere: Secondary | ICD-10-CM | POA: Diagnosis not present

## 2022-11-03 DIAGNOSIS — J019 Acute sinusitis, unspecified: Secondary | ICD-10-CM

## 2022-11-03 MED ORDER — AMOXICILLIN-POT CLAVULANATE 875-125 MG PO TABS
1.0000 | ORAL_TABLET | Freq: Two times a day (BID) | ORAL | 0 refills | Status: DC
  Filled 2022-11-03: qty 14, 7d supply, fill #0

## 2022-11-03 NOTE — Progress Notes (Signed)
I have spent 5 minutes in review of e-visit questionnaire, review and updating patient chart, medical decision making and response to patient.   Jarquis Walker Cody Akeyla Molden, PA-C    

## 2022-11-03 NOTE — Progress Notes (Signed)

## 2022-11-20 ENCOUNTER — Ambulatory Visit: Payer: BC Managed Care – PPO | Admitting: Family Medicine

## 2022-12-10 ENCOUNTER — Ambulatory Visit: Payer: BC Managed Care – PPO | Admitting: Family Medicine

## 2022-12-10 NOTE — Progress Notes (Signed)
[facility] [address] [place] [address] Phone: [phone] Subjective:   Bruce Donath, am serving as a scribe for Dr. Antoine Primas.  I'm seeing this patient by the request  of:  Pincus Sanes, MD  CC: Back and neck pain follow-up  [id number]  Ashley Barajas is a 42 y.o. female coming in with complaint of back and neck pain. OMT on 10/29/2022. Patient states that her back pain is worse today but has been doing ok up until today. Pain B SI joints.   Medications patient has been prescribed: Vit D  Taking: Yes         Reviewed prior external information including notes and imaging from previsou exam, outside providers and external EMR if available.   As well as notes that were available from care everywhere and other healthcare systems.  Past medical history, social, surgical and family history all reviewed in electronic medical record.  No pertanent information unless stated regarding to the chief complaint.   Past Medical History:  Diagnosis Date   Amenorrhea, unspecified    states no menses in 9 yrs.   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 PAIN, JOINT PAIN, FATIGUE, CHRONIC CONSTIPATION     Review of Systems:  No headache, visual changes, nausea, vomiting, diarrhea, constipation, dizziness, abdominal pain, skin rash, fevers, chills, night sweats, weight loss, swollen lymph nodes, body aches, joint swelling, chest pain, shortness of breath, mood changes. POSITIVE muscle aches  Objective  Blood pressure 124/82, pulse 81, height 5\' 3"  (1.6 m), weight 133 lb (60.3 kg), SpO2 98 %.   General: No apparent distress alert and oriented x3 mood  and affect normal, dressed appropriately.  HEENT: Pupils equal, extraocular movements intact  Respiratory: Patient's speak in full sentences and does not appear short of breath  Cardiovascular: No lower extremity edema, non tender, no erythema  Low back does have some tightness noted with some mild loss of lordosis still noted.  Patient does have some tightness noted with FABER test bilaterally.  Patient does have only mild worsening pain with extension greater than 10 degrees.  5 out of 5 strength throughout the lower extremities  Osteopathic findings  C2 flexed rotated and side bent right C7 flexed rotated and side bent left T3 extended rotated and side bent right inhaled rib T8 extended rotated and side bent left L3 flexed rotated and side bent right L5 flexed rotated and side bent left Sacrum right on right     Assessment and Plan:  Low back pain Low back pain does have some loss lordosis noted.  Some tenderness to palpation of the paraspinal musculature.  Patient does have a negative straight leg test but continues to have the sacroiliac joint discomfort.  Right hamstring injury also and we will continue to monitor.  Follow-up again in 6 to 8 weeks otherwise.    Nonallopathic problems  Decision today to treat with OMT was based on Physical Exam  After verbal consent patient was treated with HVLA, ME, FPR techniques in cervical, rib, thoracic, lumbar, and sacral  areas  Patient tolerated the procedure well  with improvement in symptoms  Patient given exercises, stretches and lifestyle modifications  See medications in patient instructions if given  Patient will follow up in 4-8 weeks    The above documentation has been reviewed and is accurate and complete Judi Saa, DO          Note: This dictation was prepared with Dragon dictation along with smaller phrase technology. Any transcriptional errors that result from this process are unintentional.

## 2022-12-12 ENCOUNTER — Encounter: Payer: Self-pay | Admitting: Family Medicine

## 2022-12-12 ENCOUNTER — Ambulatory Visit: Payer: BC Managed Care – PPO | Admitting: Family Medicine

## 2022-12-12 VITALS — BP 124/82 | HR 81 | Ht 63.0 in | Wt 133.0 lb

## 2022-12-12 DIAGNOSIS — M9908 Segmental and somatic dysfunction of rib cage: Secondary | ICD-10-CM

## 2022-12-12 DIAGNOSIS — M9904 Segmental and somatic dysfunction of sacral region: Secondary | ICD-10-CM | POA: Diagnosis not present

## 2022-12-12 DIAGNOSIS — M545 Low back pain, unspecified: Secondary | ICD-10-CM

## 2022-12-12 DIAGNOSIS — M9903 Segmental and somatic dysfunction of lumbar region: Secondary | ICD-10-CM

## 2022-12-12 DIAGNOSIS — M9902 Segmental and somatic dysfunction of thoracic region: Secondary | ICD-10-CM

## 2022-12-12 DIAGNOSIS — M9901 Segmental and somatic dysfunction of cervical region: Secondary | ICD-10-CM

## 2022-12-12 NOTE — Patient Instructions (Signed)
You know you have hw now Try the medicine ball trick if needed See you again in 2 months

## 2022-12-12 NOTE — Assessment & Plan Note (Signed)
Low back pain does have some loss lordosis noted.  Some tenderness to palpation of the paraspinal musculature.  Patient does have a negative straight leg test but continues to have the sacroiliac joint discomfort.  Right hamstring injury also and we will continue to monitor.  Follow-up again in 6 to 8 weeks otherwise.

## 2022-12-23 ENCOUNTER — Other Ambulatory Visit: Payer: Self-pay

## 2022-12-26 ENCOUNTER — Encounter: Payer: Self-pay | Admitting: Family Medicine

## 2023-01-08 ENCOUNTER — Other Ambulatory Visit: Payer: Self-pay

## 2023-02-09 NOTE — Progress Notes (Unsigned)
[facility] [address] [place] [address] Phone: [phone] Subjective:   Bruce Donath, am serving as a scribe for Dr. Antoine Primas.  I'm seeing this patient by the request  of:  Pincus Sanes, MD  CC: back and neck pain   [id number]  Ashley Barajas is a 42 y.o. female coming in with complaint of back and neck pain. OMT on 12/12/2022. Patient states that her back pain has improved but her stomach pain is still present.   Medications patient has been prescribed:   Taking:         Reviewed prior external information including notes and imaging from previsou exam, outside providers and external EMR if available.   As well as notes that were available from care everywhere and other healthcare systems.  Past medical history, social, surgical and family history all reviewed in electronic medical record.  No pertanent information unless stated regarding to the chief complaint.   Past Medical History:  Diagnosis Date   Amenorrhea, unspecified    states no menses in 9 yrs.   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 PAIN, JOINT PAIN, FATIGUE, CHRONIC CONSTIPATION     Review of Systems:  No headache, visual changes, nausea, vomiting, diarrhea, constipation, dizziness, abdominal pain, skin rash, fevers, chills, night sweats, weight loss, swollen lymph nodes, body aches, joint swelling, chest pain, shortness of breath, mood changes. POSITIVE muscle aches  Objective  Blood pressure 112/74, pulse 86, height 5\' 3"  (1.6 m), weight 138 lb (62.6 kg), SpO2 98%.   General: No apparent distress alert and oriented x3 mood and affect normal, dressed  appropriately.  HEENT: Pupils equal, extraocular movements intact  Respiratory: Patient's speak in full sentences and does not appear short of breath  Cardiovascular: No lower extremity edema, non tender, no erythema  Back does have loss of lordosis noted.  Tightness of the hip flexors bilaterally seems to be right greater than left low today.  Still does have the sacroiliac pain.  Does have pain with internal rotation of the left hip. Right ankle exam does have some audible popping over the peroneal tendons.  Tender to palpation on the inferior aspect of the lateral malleolus over the soft tissue and not the bone itself.  Able to go up on her toes without any significant difficulty.  Osteopathic findings  C2 flexed rotated and side bent right C6 flexed rotated and side bent left T3 extended rotated and side bent right inhaled rib T9 extended rotated and side bent left L2 flexed rotated and side bent right L5 flexed rotated side bent left Sacrum left on left       Assessment and Plan:  Low back pain Low back still has loss of lumbar lordosis.  Discussed icing regimen and home exercises.  Discussed which activities to do and which ones to avoid.  Increase activity slowly.  Patient is still having some difficulty with her abdominal pain.  Still left-sided.  I do feel that referral to gastroenterology for further evaluation and treatment will be beneficial.  Does have the labral pathology noted of the left hip  and we will continue monitor.  Follow-up with me again 2 to 3 months  Left sided abdominal pain Out of proportion at this time.  Discussed with patient that worsening symptoms acutely to seek medical attention.  Will refer to gastroenterology for further evaluation.  Peroneal tendinitis Right side, discussed icing regimen and home exercises, discussed which activities to do and which ones to avoid.  Increase activity slowly.  Discussed possible heel that would be beneficial.   Follow-up with me again in 6 to 8 weeks otherwise.    Nonallopathic problems  Decision today to treat with OMT was based on Physical Exam  After verbal consent patient was treated with HVLA, ME, FPR techniques in cervical, rib, thoracic, lumbar, and sacral  areas  Patient tolerated the procedure well with improvement in symptoms  Patient given exercises, stretches and lifestyle modifications  See medications in patient instructions if given  Patient will follow up in 4-8 weeks    The above documentation has been reviewed and is accurate and complete Ashley Saa, DO          Note: This dictation was prepared with Dragon dictation along with smaller phrase technology. Any transcriptional errors that result from this process are unintentional.

## 2023-02-11 ENCOUNTER — Other Ambulatory Visit: Payer: Self-pay

## 2023-02-11 MED ORDER — CYCLOBENZAPRINE HCL 5 MG PO TABS
5.0000 mg | ORAL_TABLET | Freq: Every evening | ORAL | 0 refills | Status: AC | PRN
  Filled 2023-02-11: qty 21, 21d supply, fill #0

## 2023-02-12 ENCOUNTER — Ambulatory Visit: Payer: BC Managed Care – PPO | Admitting: Family Medicine

## 2023-02-12 VITALS — BP 112/74 | HR 86 | Ht 63.0 in | Wt 138.0 lb

## 2023-02-12 DIAGNOSIS — M7671 Peroneal tendinitis, right leg: Secondary | ICD-10-CM | POA: Diagnosis not present

## 2023-02-12 DIAGNOSIS — M545 Low back pain, unspecified: Secondary | ICD-10-CM | POA: Diagnosis not present

## 2023-02-12 DIAGNOSIS — M9901 Segmental and somatic dysfunction of cervical region: Secondary | ICD-10-CM

## 2023-02-12 DIAGNOSIS — M9902 Segmental and somatic dysfunction of thoracic region: Secondary | ICD-10-CM | POA: Diagnosis not present

## 2023-02-12 DIAGNOSIS — R109 Unspecified abdominal pain: Secondary | ICD-10-CM | POA: Diagnosis not present

## 2023-02-12 DIAGNOSIS — M9908 Segmental and somatic dysfunction of rib cage: Secondary | ICD-10-CM

## 2023-02-12 DIAGNOSIS — M9904 Segmental and somatic dysfunction of sacral region: Secondary | ICD-10-CM

## 2023-02-12 DIAGNOSIS — M9903 Segmental and somatic dysfunction of lumbar region: Secondary | ICD-10-CM

## 2023-02-12 DIAGNOSIS — R1011 Right upper quadrant pain: Secondary | ICD-10-CM | POA: Diagnosis not present

## 2023-02-12 DIAGNOSIS — M7670 Peroneal tendinitis, unspecified leg: Secondary | ICD-10-CM | POA: Insufficient documentation

## 2023-02-12 NOTE — Assessment & Plan Note (Signed)
Right side, discussed icing regimen and home exercises, discussed which activities to do and which ones to avoid.  Increase activity slowly.  Discussed possible heel that would be beneficial.  Follow-up with me again in 6 to 8 weeks otherwise.

## 2023-02-12 NOTE — Assessment & Plan Note (Signed)
Out of proportion at this time.  Discussed with patient that worsening symptoms acutely to seek medical attention.  Will refer to gastroenterology for further evaluation.

## 2023-02-12 NOTE — Assessment & Plan Note (Signed)
Low back still has loss of lumbar lordosis.  Discussed icing regimen and home exercises.  Discussed which activities to do and which ones to avoid.  Increase activity slowly.  Patient is still having some difficulty with her abdominal pain.  Still left-sided.  I do feel that referral to gastroenterology for further evaluation and treatment will be beneficial.  Does have the labral pathology noted of the left hip and we will continue monitor.  Follow-up with me again 2 to 3 months

## 2023-02-12 NOTE — Patient Instructions (Signed)
Referral to gastro Exercises HOK[facility] recovery sandals in the house See me in 10 weeks

## 2023-03-05 IMAGING — US US ABDOMEN LIMITED
1 series · 9 of 9 positions shown · non-contrast
Comparison: None.

CLINICAL DATA: Left-sided abdominal pain for several months

EXAM:
ULTRASOUND ABDOMEN LIMITED

[Series 1: us abdomen limited · 9 of 9 slices shown]
[im 1/9]
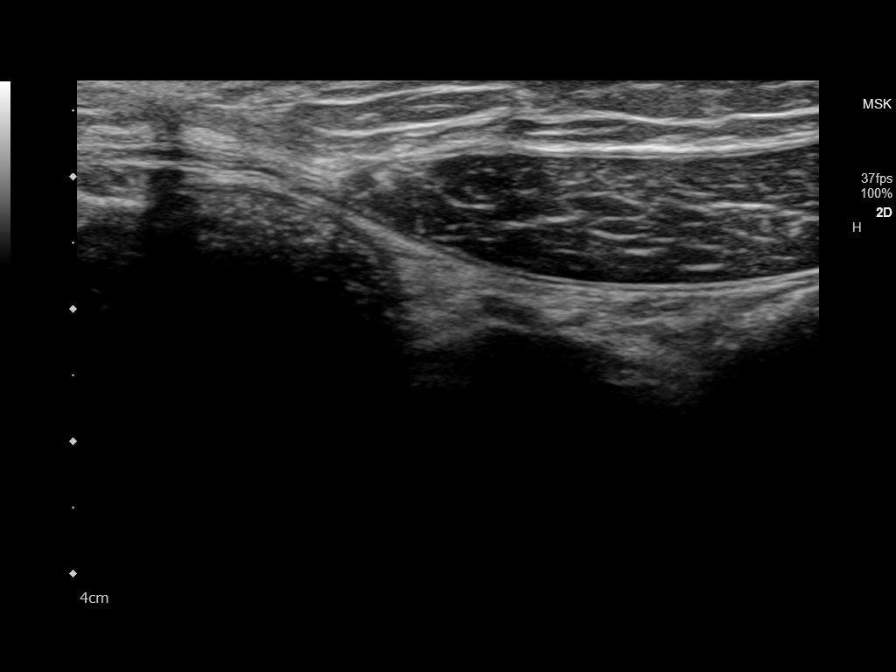
[im 2/9]
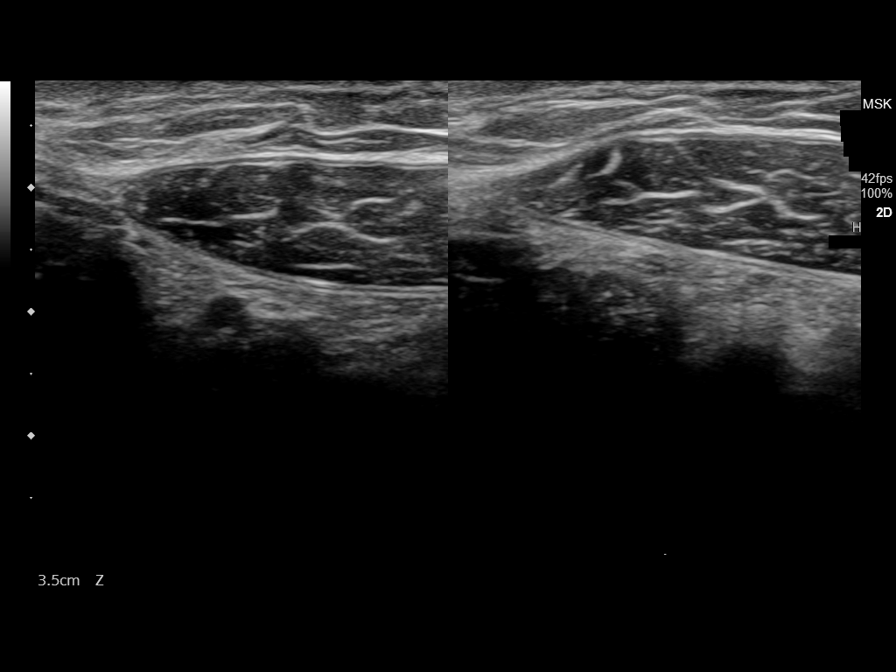
[im 3/9]
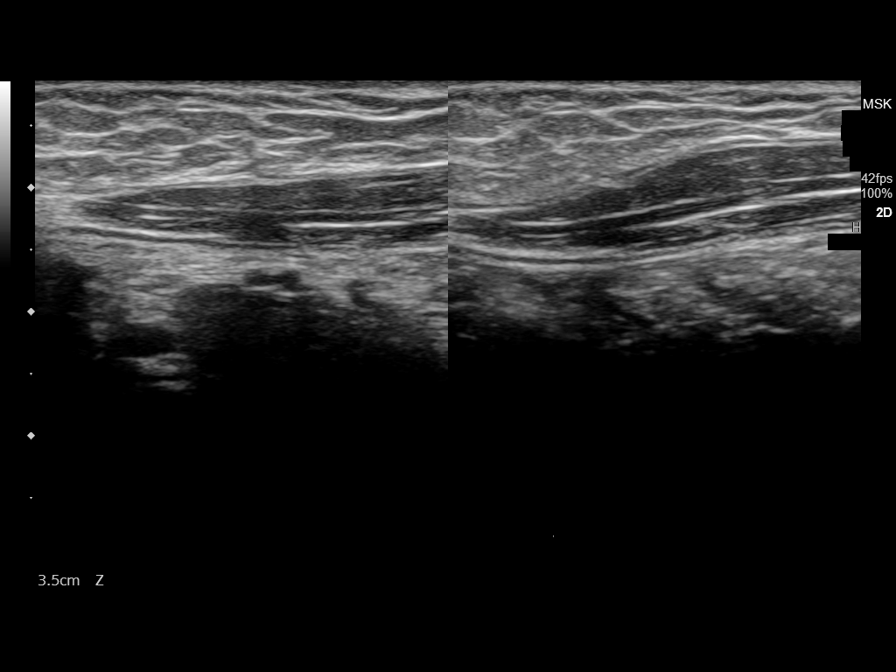
[im 4/9]
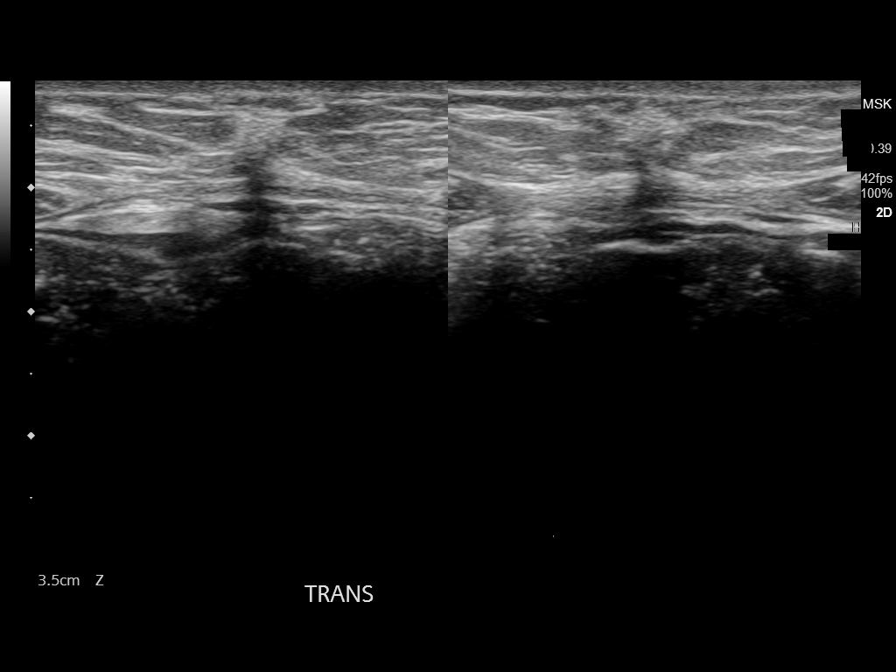
[im 5/9]
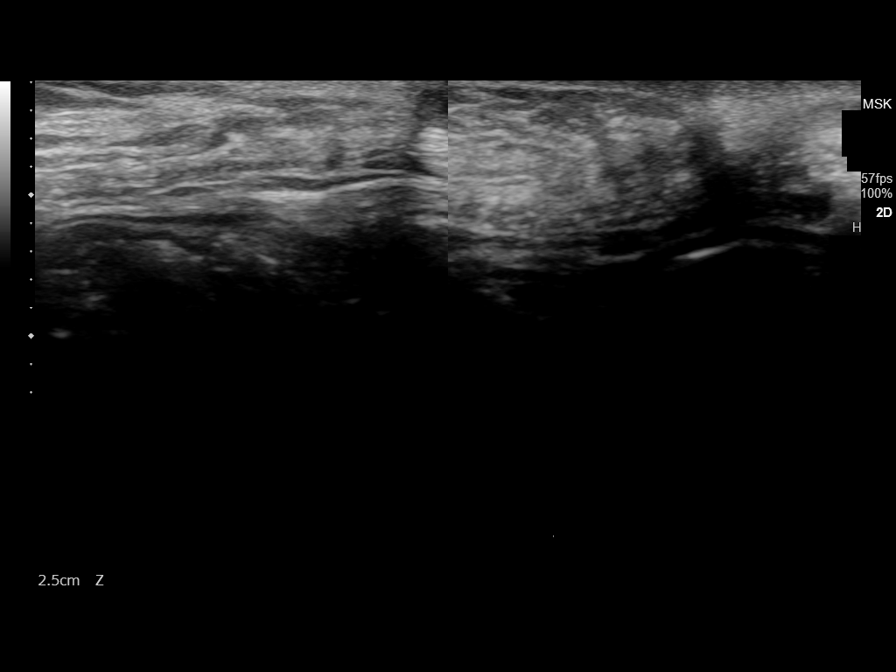
[im 6/9]
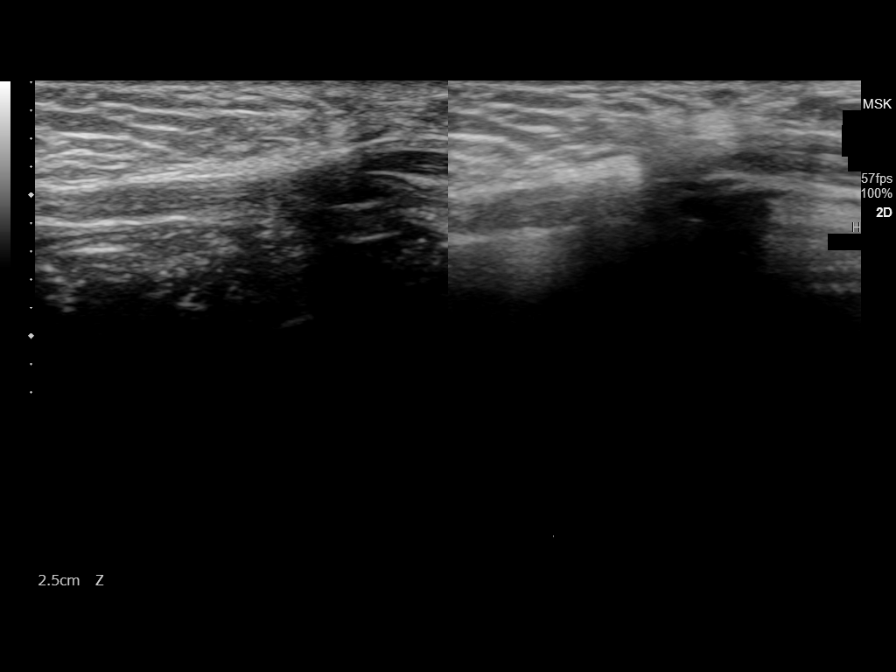
[im 7/9]
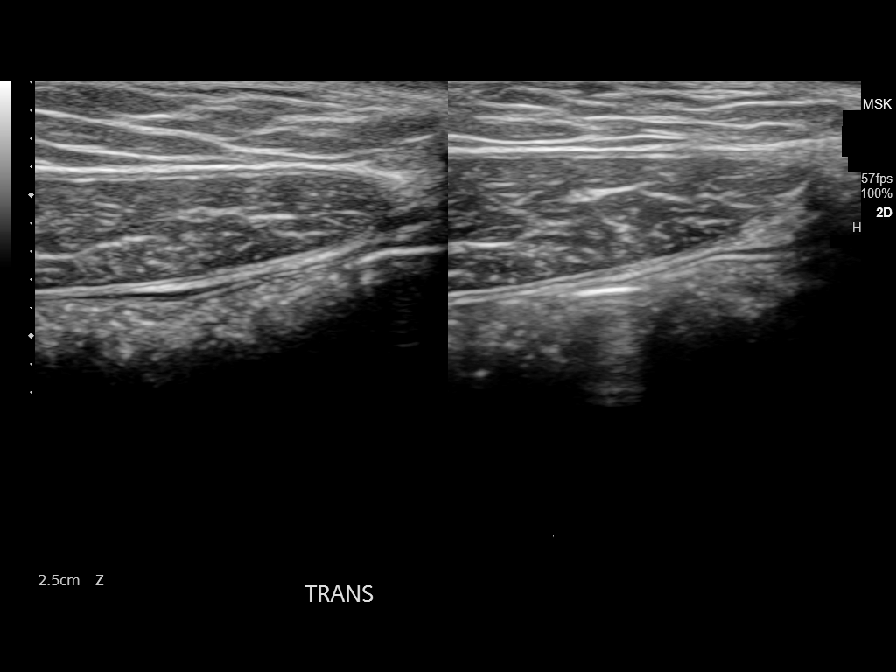
[im 8/9]
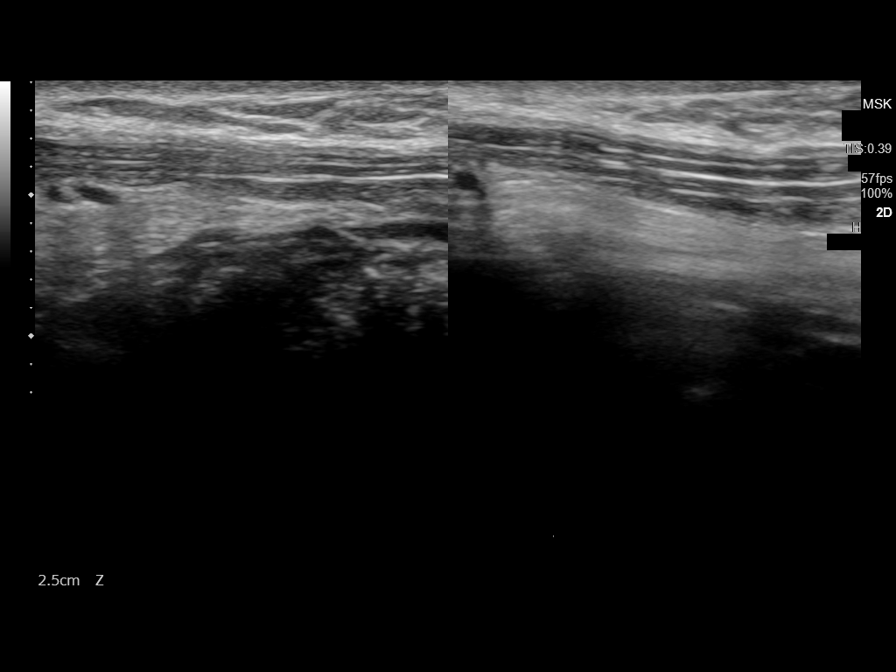
[im 9/9]
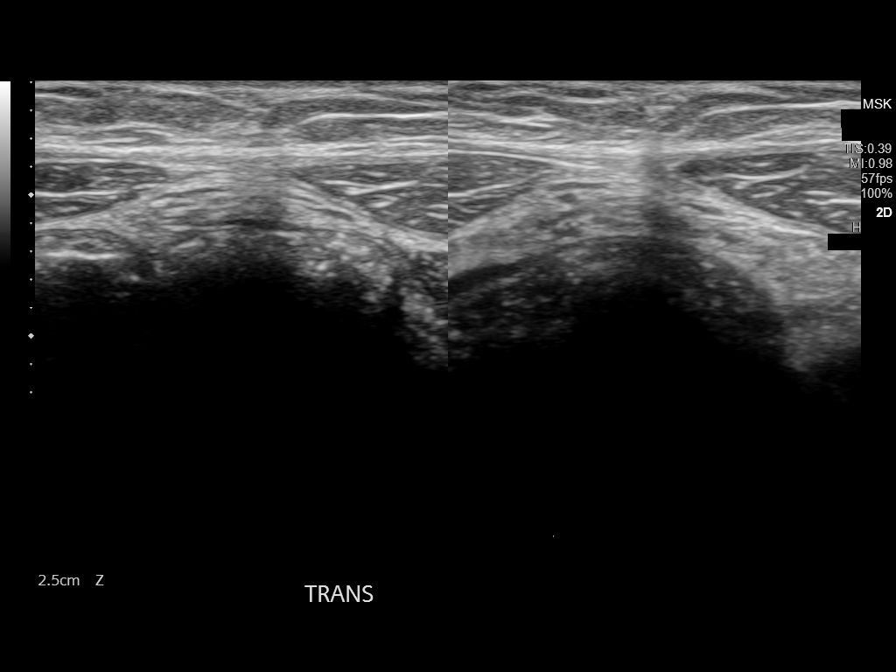

[9 of 9 positions shown; findings below may reference images not displayed]

FINDINGS: Scanning in the area of clinical concern shows no evidence of
herniation. No focal mass lesion to correspond with the given
clinical history is noted.
IMPRESSION: No acute abnormality noted.

## 2023-04-01 NOTE — Progress Notes (Unsigned)
[facility] [address] [place] [address] Phone: [phone] Subjective:   Ashley Barajas, am serving as a scribe for Dr. Antoine Primas.  I'm seeing this patient by the request  of:  Pincus Sanes, MD  CC: Back and neck pain follow-up  [id number]  Ashley Barajas is a 42 y.o. female coming in with complaint of back and neck pain. OMT on 02/12/2023. Was squatting down to put weight on her barbell and developed pain in lower L side of lumbar spine. Denies any radiating symptoms.   Medications patient has been prescribed:   Taking:         Reviewed prior external information including notes and imaging from previsou exam, outside providers and external EMR if available.   As well as notes that were available from care everywhere and other healthcare systems.  Past medical history, social, surgical and family history all reviewed in electronic medical record.  No pertanent information unless stated regarding to the chief complaint.   Past Medical History:  Diagnosis Date   Amenorrhea, unspecified    states no menses in 9 yrs.   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 PAIN, JOINT PAIN, FATIGUE, CHRONIC CONSTIPATION     Review of Systems:  No headache, visual changes, nausea, vomiting, diarrhea, constipation, dizziness, abdominal pain, skin rash, fevers, chills, night sweats, weight loss, swollen lymph nodes, body aches, joint swelling, chest pain, shortness of breath, mood changes. POSITIVE muscle aches  Objective  Blood pressure 122/84, pulse (!) 46, height 5\' 3"  (1.6 m), weight 139 lb (63 kg), SpO2 98%.   General: No apparent distress alert and  oriented x3 mood and affect normal, dressed appropriately.  HEENT: Pupils equal, extraocular movements intact  Respiratory: Patient's speak in full sentences and does not appear short of breath  Cardiovascular: No lower extremity edema, non tender, no erythema  Low back exam does show the patient does have worsening pain with exacerbation with extension of the back than usual.  Seems to be on the left greater than the right.  More pain over the iliolumbar ligament   Osteopathic findings  C7 flexed rotated and side bent left T3 extended rotated and side bent right inhaled rib T9 extended rotated and side bent left L2 flexed rotated and side bent right L5 flexed rotated and side bent left Sacrum left on left    Assessment and Plan:  Low back pain Low back does have some loss of lordosis noted.  Some tenderness to palpation in the paraspinal musculature was noted today.  Patient may have had a iliolumbar strain which is different than what was previously seen.  Discussed with patient about icing regimen and home exercises.  Follow-up again in 6 to 8 weeks otherwise    Nonallopathic problems  Decision today to treat with OMT was based on Physical Exam  After verbal consent patient was treated with HVLA, ME, FPR techniques in cervical, rib, thoracic, lumbar, and sacral  areas  Patient tolerated the procedure well with improvement in symptoms  Patient given exercises, stretches and lifestyle modifications  See medications in patient instructions  if given  Patient will follow up in 4-8 weeks    The above documentation has been reviewed and is accurate and complete Judi Saa, DO          Note: This dictation was prepared with Dragon dictation along with smaller phrase technology. Any transcriptional errors that result from this process are unintentional.

## 2023-04-02 ENCOUNTER — Encounter: Payer: Self-pay | Admitting: Family Medicine

## 2023-04-02 ENCOUNTER — Ambulatory Visit: Payer: BC Managed Care – PPO | Admitting: Family Medicine

## 2023-04-02 VITALS — BP 122/84 | HR 46 | Ht 63.0 in | Wt 139.0 lb

## 2023-04-02 DIAGNOSIS — M9908 Segmental and somatic dysfunction of rib cage: Secondary | ICD-10-CM

## 2023-04-02 DIAGNOSIS — M9903 Segmental and somatic dysfunction of lumbar region: Secondary | ICD-10-CM

## 2023-04-02 DIAGNOSIS — M9901 Segmental and somatic dysfunction of cervical region: Secondary | ICD-10-CM | POA: Diagnosis not present

## 2023-04-02 DIAGNOSIS — M9904 Segmental and somatic dysfunction of sacral region: Secondary | ICD-10-CM

## 2023-04-02 DIAGNOSIS — M545 Low back pain, unspecified: Secondary | ICD-10-CM | POA: Diagnosis not present

## 2023-04-02 DIAGNOSIS — M9902 Segmental and somatic dysfunction of thoracic region: Secondary | ICD-10-CM

## 2023-04-02 NOTE — Patient Instructions (Signed)
I think this was a fluke Lift in one plane only See me again in 4-6 weeks

## 2023-04-02 NOTE — Assessment & Plan Note (Signed)
Low back does have some loss of lordosis noted.  Some tenderness to palpation in the paraspinal musculature was noted today.  Patient may have had a iliolumbar strain which is different than what was previously seen.  Discussed with patient about icing regimen and home exercises.  Follow-up again in 6 to 8 weeks otherwise

## 2023-04-08 ENCOUNTER — Ambulatory Visit: Payer: BC Managed Care – PPO | Admitting: Family Medicine

## 2023-04-23 ENCOUNTER — Ambulatory Visit: Payer: BC Managed Care – PPO | Admitting: Family Medicine

## 2023-05-06 NOTE — Progress Notes (Unsigned)
[facility] [address] [place] [address] Phone: [phone] Subjective:   Ashley Barajas, am serving as a scribe for Dr. Antoine Primas.  I'm seeing this patient by the request  of:  Pincus Sanes, MD  CC: Back and neck pain follow-up  [id number]  Ashley Barajas is a 42 y.o. female coming in with complaint of back and neck pain. OMT 04/02/2023. Patient states same per usual. A little bit of pain in L lower back. Sometimes its a burning sensation.  Medications patient has been prescribed: None  Taking:         Reviewed prior external information including notes and imaging from previsou exam, outside providers and external EMR if available.   As well as notes that were available from care everywhere and other healthcare systems.  Past medical history, social, surgical and family history all reviewed in electronic medical record.  No pertanent information unless stated regarding to the chief complaint.   Past Medical History:  Diagnosis Date   Amenorrhea, unspecified    states no menses in 9 yrs.   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 PAIN, JOINT PAIN, FATIGUE, CHRONIC CONSTIPATION     Review of Systems:  No headache, visual changes, nausea, vomiting, diarrhea, constipation, dizziness, abdominal pain, skin rash, fevers, chills, night sweats, weight loss, swollen lymph nodes, body aches, joint swelling, chest pain, shortness of breath, mood changes. POSITIVE muscle aches  Objective  Blood pressure 112/70, pulse 82, height 5\' 3"  (1.6 m), weight 135 lb (61.2 kg), SpO2 96%.   General: No apparent distress alert and oriented x3 mood and affect  normal, dressed appropriately.  HEENT: Pupils equal, extraocular movements intact  Respiratory: Patient's speak in full sentences and does not appear short of breath  Cardiovascular: No lower extremity edema, non tender, no erythema  MSK:  Back exam does have some loss of lordosis noted.  Some tightness noted in the paraspinal musculature.  Tightness of the right hamstring still noted.  Some tenderness over the greater trochanteric area.  Osteopathic findings  C2 flexed rotated and side bent right C7 flexed rotated and side bent left T3 extended rotated and side bent right inhaled rib T7 extended rotated and side bent left L2 flexed rotated and side bent right Sacrum right on right  After verbal consent patient was prepped with alcohol swab and with a 21-gauge 2 inch needle injected into the left sacroiliac joint with a total of 0.5 cc of 0.5% Marcaine and 0.5 cc of Kenalog 40 mg/mL.  No blood loss, Band-Aid placed.  Postinjection instructions given.   Assessment and Plan:  Low back pain Low back pain seems to be multifactorial.  Seem to be more associated with the sacroiliac joint today.  Discussed icing regimen and home exercises, discussed which activities to do and which ones to avoid.  Increase activity slowly over the course of the next 6 to 8 weeks.  Chronic left SI joint pain Patient given injection today and tolerated the procedure well with fairly significant improvement.  Seems to be acute on chronic in nature.  Discussed icing regimen  and home exercises, discussed core strengthening today.  Patient does stay active.  Patient will continue to make improvement.  Follow-up again in 6 to 8 weeks.    Nonallopathic problems  Decision today to treat with OMT was based on Physical Exam  After verbal consent patient was treated with HVLA, ME, FPR techniques in cervical, rib, thoracic, lumbar, and sacral  areas  Patient tolerated the procedure well with improvement in  symptoms  Patient given exercises, stretches and lifestyle modifications  See medications in patient instructions if given  Patient will follow up in 4-8 weeks     The above documentation has been reviewed and is accurate and complete Judi Saa, DO         Note: This dictation was prepared with Dragon dictation along with smaller phrase technology. Any transcriptional errors that result from this process are unintentional.

## 2023-05-07 ENCOUNTER — Ambulatory Visit: Payer: BC Managed Care – PPO | Admitting: Family Medicine

## 2023-05-07 ENCOUNTER — Encounter: Payer: Self-pay | Admitting: Family Medicine

## 2023-05-07 VITALS — BP 112/70 | HR 82 | Ht 63.0 in | Wt 135.0 lb

## 2023-05-07 DIAGNOSIS — M9903 Segmental and somatic dysfunction of lumbar region: Secondary | ICD-10-CM

## 2023-05-07 DIAGNOSIS — M9904 Segmental and somatic dysfunction of sacral region: Secondary | ICD-10-CM | POA: Diagnosis not present

## 2023-05-07 DIAGNOSIS — M545 Low back pain, unspecified: Secondary | ICD-10-CM

## 2023-05-07 DIAGNOSIS — M9902 Segmental and somatic dysfunction of thoracic region: Secondary | ICD-10-CM | POA: Diagnosis not present

## 2023-05-07 DIAGNOSIS — M9908 Segmental and somatic dysfunction of rib cage: Secondary | ICD-10-CM | POA: Diagnosis not present

## 2023-05-07 DIAGNOSIS — M9901 Segmental and somatic dysfunction of cervical region: Secondary | ICD-10-CM

## 2023-05-07 DIAGNOSIS — M533 Sacrococcygeal disorders, not elsewhere classified: Secondary | ICD-10-CM

## 2023-05-07 DIAGNOSIS — G8929 Other chronic pain: Secondary | ICD-10-CM

## 2023-05-07 NOTE — Assessment & Plan Note (Signed)
Low back pain seems to be multifactorial.  Seem to be more associated with the sacroiliac joint today.  Discussed icing regimen and home exercises, discussed which activities to do and which ones to avoid.  Increase activity slowly over the course of the next 6 to 8 weeks.

## 2023-05-07 NOTE — Patient Instructions (Addendum)
Injection in SI joint today Good to see you! See you again in 2 months

## 2023-05-07 NOTE — Assessment & Plan Note (Signed)
Patient given injection today and tolerated the procedure well with fairly significant improvement.  Seems to be acute on chronic in nature.  Discussed icing regimen and home exercises, discussed core strengthening today.  Patient does stay active.  Patient will continue to make improvement.  Follow-up again in 6 to 8 weeks.

## 2023-06-24 ENCOUNTER — Ambulatory Visit: Payer: BC Managed Care – PPO | Admitting: Family Medicine

## 2023-07-02 ENCOUNTER — Ambulatory Visit: Payer: BC Managed Care – PPO | Admitting: Family Medicine

## 2023-07-02 DIAGNOSIS — Z6824 Body mass index (BMI) 24.0-24.9, adult: Secondary | ICD-10-CM | POA: Diagnosis not present

## 2023-07-02 DIAGNOSIS — Z01419 Encounter for gynecological examination (general) (routine) without abnormal findings: Secondary | ICD-10-CM | POA: Diagnosis not present

## 2023-07-02 DIAGNOSIS — Z1231 Encounter for screening mammogram for malignant neoplasm of breast: Secondary | ICD-10-CM | POA: Diagnosis not present

## 2023-07-10 NOTE — Progress Notes (Unsigned)
[facility] [address] [place] [address] Phone: [phone] Subjective:   Bruce Donath, am serving as a scribe for Dr. Antoine Primas.  I'm seeing this patient by the request  of:  Pincus Sanes, MD  CC: Neck and back pain follow-up  [id number]  Ashley Barajas is a 42 y.o. female coming in with complaint of back and neck pain. OMT 05/07/2023.  Patient also had severe pains with having a sacroiliac joint injection.  Patient states that she has had some pain in lumbar spine but it has been manageable.   Medications patient has been prescribed: None  Taking:    Reviewing patient's imaging did have a ultrasound of the abdomen done in March 2023 that was unremarkable.  X-ray of the lumbar spine did not show any significant bony abnormality noted.  MRI of the hip was relatively inconclusive but could have a potential labral pathology.     Reviewed prior external information including notes and imaging from previsou exam, outside providers and external EMR if available.   As well as notes that were available from care everywhere and other healthcare systems.  Past medical history, social, surgical and family history all reviewed in electronic medical record.  No pertanent information unless stated regarding to the chief complaint.   Past Medical History:  Diagnosis Date   Amenorrhea, unspecified    states no menses in 9 yrs.   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 PAIN, JOINT PAIN, FATIGUE, CHRONIC CONSTIPATION     Review of Systems:  No headache, visual changes, nausea, vomiting, diarrhea, constipation, dizziness, abdominal pain, skin rash, fevers,  chills, night sweats, weight loss, swollen lymph nodes, body aches, joint swelling, chest pain, shortness of breath, mood changes. POSITIVE muscle aches  Objective  Blood pressure 112/74, pulse (!) 59, height 5\' 3"  (1.6 m), weight 136 lb (61.7 kg), SpO2 98%.   General: No apparent distress alert and oriented x3 mood and affect normal, dressed appropriately.  HEENT: Pupils equal, extraocular movements intact  Respiratory: Patient's speak in full sentences and does not appear short of breath  Cardiovascular: No lower extremity edema, non tender, no erythema  Gait MSK:  Back tender to palpation mostly over the sacroiliac joint still.  Seems to be left greater than right.  Patient does have tightness noted over the left sacroiliac joint.  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  Injection did not make a significant difference.  Continuing to increase activity as tolerated.  If worsening pain will consider the possibility of advanced imaging.  Believe that the patient should do relatively well.  Follow-up again in 6 to 8 weeks otherwise.    Nonallopathic problems  Decision today to treat with OMT was based on Physical Exam  After verbal consent patient was treated with HVLA, ME, FPR techniques in cervical, rib, thoracic, lumbar, and sacral  areas  Patient tolerated the procedure well with improvement in symptoms  Patient given exercises, stretches and lifestyle modifications  See medications in patient instructions if given  Patient will follow up in 4-8 weeks     The above documentation has been  reviewed and is accurate and complete Judi Saa, DO         Note: This dictation was prepared with Dragon dictation along with smaller phrase technology. Any transcriptional errors that result from this process are unintentional.

## 2023-07-13 ENCOUNTER — Ambulatory Visit: Payer: BC Managed Care – PPO | Admitting: Family Medicine

## 2023-07-13 ENCOUNTER — Encounter: Payer: Self-pay | Admitting: Family Medicine

## 2023-07-13 VITALS — BP 112/74 | HR 59 | Ht 63.0 in | Wt 136.0 lb

## 2023-07-13 DIAGNOSIS — M533 Sacrococcygeal disorders, not elsewhere classified: Secondary | ICD-10-CM

## 2023-07-13 DIAGNOSIS — M9901 Segmental and somatic dysfunction of cervical region: Secondary | ICD-10-CM

## 2023-07-13 DIAGNOSIS — M9903 Segmental and somatic dysfunction of lumbar region: Secondary | ICD-10-CM | POA: Diagnosis not present

## 2023-07-13 DIAGNOSIS — M9902 Segmental and somatic dysfunction of thoracic region: Secondary | ICD-10-CM

## 2023-07-13 DIAGNOSIS — M9904 Segmental and somatic dysfunction of sacral region: Secondary | ICD-10-CM | POA: Diagnosis not present

## 2023-07-13 DIAGNOSIS — M9908 Segmental and somatic dysfunction of rib cage: Secondary | ICD-10-CM

## 2023-07-13 DIAGNOSIS — G8929 Other chronic pain: Secondary | ICD-10-CM

## 2023-07-13 NOTE — Patient Instructions (Signed)
Good to see you Making improvements See me in 10-12 weeks

## 2023-07-13 NOTE — Assessment & Plan Note (Signed)
Chronic left sacroiliac joint pain.  Injection did not make a significant difference.  Continuing to increase activity as tolerated.  If worsening pain will consider the possibility of advanced imaging.  Believe that the patient should do relatively well.  Follow-up again in 6 to 8 weeks otherwise.

## 2023-08-10 ENCOUNTER — Encounter: Payer: Self-pay | Admitting: Family Medicine

## 2023-08-11 ENCOUNTER — Other Ambulatory Visit: Payer: Self-pay

## 2023-08-11 DIAGNOSIS — R1032 Left lower quadrant pain: Secondary | ICD-10-CM

## 2023-08-13 ENCOUNTER — Ambulatory Visit: Payer: BC Managed Care – PPO | Admitting: Family Medicine

## 2023-09-24 ENCOUNTER — Ambulatory Visit: Payer: BC Managed Care – PPO | Admitting: Family Medicine

## 2023-11-06 NOTE — Progress Notes (Signed)
 [facility] [address] [place] [address] Phone: [phone] Subjective:   Ashley Barajas, am serving as a scribe for Dr. Ronnell Coins.  I'm seeing this patient by the request  of:  Ashley Dauphin, MD  CC: Back and neck pain follow-up  [id number]  Ashley Barajas is a 43 y.o. female coming in with complaint of back and neck pain. OMT on 07/13/2023. Patient states same per usual. No new symptoms. Low back pain.  Medications patient has been prescribed:   Taking:         Reviewed prior external information including notes and imaging from previsou exam, outside providers and external EMR if available.   As well as notes that were available from care everywhere and other healthcare systems.  Past medical history, social, surgical and family history all reviewed in electronic medical record.  No pertanent information unless stated regarding to the chief complaint.   Past Medical History:  Diagnosis Date   Amenorrhea, unspecified    states no menses in 9 yrs.   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 PAIN, JOINT PAIN, FATIGUE, CHRONIC CONSTIPATION     Review of Systems:  No headache, visual changes, nausea, vomiting, diarrhea, constipation, dizziness, abdominal pain, skin rash, fevers, chills, night sweats, weight loss, swollen lymph nodes, body aches, joint swelling, chest pain, shortness of breath, mood changes. POSITIVE muscle aches  Objective  Blood pressure 112/64, pulse 69, height 5\' 3"  (1.6 m), weight 139 lb (63 kg), SpO2 98%.   General: No apparent distress alert and oriented x3 mood and affect normal, dressed appropriately.  HEENT: Pupils  equal, extraocular movements intact  Respiratory: Patient's speak in full sentences and does not appear short of breath  Cardiovascular: No lower extremity edema, non tender, no erythema  Gait normal MSK:  Back does have some loss lordosis noted.  Tightness noted with Veldon German right greater than left.  Tightness noted more of the right paraspinal musculature of the lumbar spine also noted.  Osteopathic findings  C2 flexed rotated and side bent right C6 flexed rotated and side bent left T3 extended rotated and side bent right inhaled rib T9 extended rotated and side bent left L2 flexed rotated and side bent right L3 flexed rotated and side bent left Sacrum right on right       Assessment and Plan:  Chronic left SI joint pain Continue to work on core strengthening.  We discussed over-the-counter natural supplementations that could be beneficial.  Discussed icing regimen and home exercises.  Continue to work on some hip abductor strengthening.  We did discuss the possibility of hormonal aspects that could be contributing as well.  Follow-up again in 6 to 8 weeks we will also get labs today though to further evaluate some of patient's fatigue and pain.    Nonallopathic problems  Decision today to treat with OMT was based on Physical Exam  After verbal consent patient was treated with HVLA, ME, FPR techniques in cervical, rib, thoracic, lumbar, and sacral  areas  Patient tolerated the procedure well with improvement in symptoms  Patient given exercises, stretches  and lifestyle modifications  See medications in patient instructions if given  Patient will follow up in 4-8 weeks     The above documentation has been reviewed and is accurate and complete [name] M Brittan Mapel, DO         Note: This dictation was prepared with Dragon dictation along with smaller phrase technology. Any transcriptional errors that result from this process are unintentional.

## 2023-11-10 ENCOUNTER — Encounter: Payer: Self-pay | Admitting: Family Medicine

## 2023-11-10 ENCOUNTER — Ambulatory Visit: Admitting: Family Medicine

## 2023-11-10 VITALS — BP 112/64 | HR 69 | Ht 63.0 in | Wt 139.0 lb

## 2023-11-10 DIAGNOSIS — M9901 Segmental and somatic dysfunction of cervical region: Secondary | ICD-10-CM

## 2023-11-10 DIAGNOSIS — M2550 Pain in unspecified joint: Secondary | ICD-10-CM | POA: Diagnosis not present

## 2023-11-10 DIAGNOSIS — M9904 Segmental and somatic dysfunction of sacral region: Secondary | ICD-10-CM | POA: Diagnosis not present

## 2023-11-10 DIAGNOSIS — M9908 Segmental and somatic dysfunction of rib cage: Secondary | ICD-10-CM

## 2023-11-10 DIAGNOSIS — M9902 Segmental and somatic dysfunction of thoracic region: Secondary | ICD-10-CM | POA: Diagnosis not present

## 2023-11-10 DIAGNOSIS — G8929 Other chronic pain: Secondary | ICD-10-CM

## 2023-11-10 DIAGNOSIS — M533 Sacrococcygeal disorders, not elsewhere classified: Secondary | ICD-10-CM

## 2023-11-10 DIAGNOSIS — M9903 Segmental and somatic dysfunction of lumbar region: Secondary | ICD-10-CM

## 2023-11-10 LAB — FOLATE: Folate: 17.2 ng/mL (ref >5.9)

## 2023-11-10 LAB — VITAMIN B12: Vitamin B-12: 529 pg/mL (ref 211–911)

## 2023-11-10 LAB — LUTEINIZING HORMONE: LH: 1.53 m[IU]/mL

## 2023-11-10 LAB — CORTISOL: Cortisol, Plasma: 5.4 ug/dL

## 2023-11-10 LAB — FOLLICLE STIMULATING HORMONE: FSH: 2.1 m[IU]/mL

## 2023-11-10 NOTE — Patient Instructions (Signed)
 Labs today

## 2023-11-10 NOTE — Assessment & Plan Note (Addendum)
 Continue to work on core strengthening.  We discussed over-the-counter natural supplementations that could be beneficial.  Discussed icing regimen and home exercises.  Continue to work on some hip abductor strengthening.  We did discuss the possibility of hormonal aspects that could be contributing as well.  Follow-up again in 6 to 8 weeks we will also get labs today though to further evaluate some of patient's fatigue and pain.

## 2023-11-14 LAB — DHEA: DHEA: 145 ng/dL

## 2023-11-15 ENCOUNTER — Encounter: Payer: Self-pay | Admitting: Family Medicine

## 2023-12-22 ENCOUNTER — Encounter: Payer: Self-pay | Admitting: Family Medicine

## 2023-12-22 ENCOUNTER — Ambulatory Visit: Admitting: Family Medicine

## 2023-12-22 VITALS — BP 102/60 | HR 61 | Ht 63.0 in

## 2023-12-22 DIAGNOSIS — M9902 Segmental and somatic dysfunction of thoracic region: Secondary | ICD-10-CM

## 2023-12-22 DIAGNOSIS — M2550 Pain in unspecified joint: Secondary | ICD-10-CM | POA: Diagnosis not present

## 2023-12-22 DIAGNOSIS — G8929 Other chronic pain: Secondary | ICD-10-CM

## 2023-12-22 DIAGNOSIS — M9908 Segmental and somatic dysfunction of rib cage: Secondary | ICD-10-CM

## 2023-12-22 DIAGNOSIS — M533 Sacrococcygeal disorders, not elsewhere classified: Secondary | ICD-10-CM

## 2023-12-22 DIAGNOSIS — M9901 Segmental and somatic dysfunction of cervical region: Secondary | ICD-10-CM | POA: Diagnosis not present

## 2023-12-22 DIAGNOSIS — M9904 Segmental and somatic dysfunction of sacral region: Secondary | ICD-10-CM | POA: Diagnosis not present

## 2023-12-22 DIAGNOSIS — M9903 Segmental and somatic dysfunction of lumbar region: Secondary | ICD-10-CM | POA: Diagnosis not present

## 2023-12-22 NOTE — Patient Instructions (Addendum)
 NO leg day until Thursday Labs today See you again in 6-8 weeks

## 2023-12-22 NOTE — Assessment & Plan Note (Signed)
 Chronic problem and MRI with paraspinal musculature tightness noted today.  Discussed icing regimen and home exercises, discussed avoiding certain activities.  Increase activity slowly.  Follow-up again in 6 to 8 weeks

## 2023-12-22 NOTE — Progress Notes (Signed)
 [name] [address] [place] [address] Phone: [phone] Subjective:   Ashley Barajas am a scribe for Dr. Felipe Horton.   I'm seeing this patient by the request  of:  Colene Dauphin, MD  CC: Back and neck pain  [id number]  Ashley Barajas is a 43 y.o. female coming in with complaint of back and neck pain. OMT on 11/10/2023. Patient states neck is ok. Back was ok until yesterday. Tried to catch some falling things that fell off the top of her [employer]. Same area and feeling as it was a couple months ago.   Medications patient has been prescribed:   Taking:         Reviewed prior external information including notes and imaging from previsou exam, outside providers and external EMR if available.   As well as notes that were available from care everywhere and other healthcare systems.  Past medical history, social, surgical and family history all reviewed in electronic medical record.  No pertanent information unless stated regarding to the chief complaint.   Past Medical History:  Diagnosis Date   Amenorrhea, unspecified    states no menses in 9 yrs.   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 PAIN, JOINT PAIN, FATIGUE, CHRONIC CONSTIPATION     Review of Systems:  No headache, visual changes, nausea, vomiting, diarrhea, constipation, dizziness, abdominal pain, skin rash, fevers, chills, night sweats, weight loss, swollen lymph nodes, body aches, joint swelling, chest pain, shortness of breath, mood changes. POSITIVE muscle aches  Objective  Blood pressure 102/60, pulse 61, height 5\' 3"  (1.6 m), SpO2 98%.   General: No apparent distress alert  and oriented x3 mood and affect normal, dressed appropriately.  HEENT: Pupils equal, extraocular movements intact  Respiratory: Patient's speak in full sentences and does not appear short of breath  Cardiovascular: No lower extremity edema, non tender, no erythema  Gait MSK:  Back more tightness around the left sacroiliac joint and the left paraspinal musculature of the lumbar spine.  Tightness with flexion and extension.  Osteopathic findings  C6 flexed rotated and side bent left T3 extended rotated and side bent right inhaled rib T9 extended rotated and side bent left L3 flexed rotated and side bent left Sacrum right on right Anterior left ilium    Assessment and Plan:  No problem-specific Assessment & Plan notes found for this encounter.    Nonallopathic problems  Decision today to treat with OMT was based on Physical Exam  After verbal consent patient was treated with HVLA, ME, FPR techniques in cervical, rib, thoracic, lumbar, and sacral  areas  Patient tolerated the procedure well with improvement in symptoms  Patient given exercises, stretches and lifestyle modifications  See medications in patient instructions if given  Patient will follow up in 4-8 weeks     The above documentation has been reviewed and is accurate and complete Trapper Meech M Jourden Delmont, DO         Note: This dictation was prepared with Dragon dictation along with smaller phrase technology. Any transcriptional errors that result from this process are unintentional.

## 2023-12-25 ENCOUNTER — Ambulatory Visit: Payer: Self-pay | Admitting: Family Medicine

## 2023-12-26 LAB — PROGESTERONE: Progesterone: <0.5 ng/mL

## 2023-12-26 LAB — ESTROGENS, TOTAL: Estrogen: 266 pg/mL

## 2023-12-26 LAB — DHEA-SULFATE: DHEA-SO4: 111 ug/dL (ref 19–237)

## 2024-02-10 NOTE — Progress Notes (Unsigned)
 [facility] [address] [place] [address] Phone: [phone] Subjective:   Ashley Barajas, am serving as a scribe for Dr. Arthea Claudene.  I'm seeing this patient by the request  of:  Ashley Glade PARAS, MD  CC: Back and neck pain follow-up  YEP:Dlagzrupcz  Ashley Barajas is a 43 y.o. female coming in with complaint of back and neck pain. OMT 12/22/2023. Patient states that she is having joint pain thought her lower body. L foot has been painful if she has been sitting for a whlie. In mornings, her foot is stiff and painful near medial malleolus. Spine has not been achy.   Medications patient has been prescribed: None  Taking:    Had different labs, Splane that there is a chance for early perimenopausal     Reviewed prior external information including notes and imaging from previsou exam, outside providers and external EMR if available.   As well as notes that were available from care everywhere and other healthcare systems.  Past medical history, social, surgical and family history all reviewed in electronic medical record.  No pertanent information unless stated regarding to the chief complaint.   Past Medical History:  Diagnosis Date   Amenorrhea, unspecified    states no menses in 9 yrs.   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 PAIN, JOINT PAIN, FATIGUE, CHRONIC CONSTIPATION     Review of Systems:  No headache, visual changes, nausea, vomiting, diarrhea, constipation, dizziness, abdominal pain, skin rash, fevers, chills, night sweats, weight loss, swollen lymph nodes, body aches, joint swelling, chest pain, shortness of breath, mood changes. POSITIVE  muscle aches  Objective  Blood pressure 110/70, pulse 70, height 5' 3 (1.6 m), weight 138 lb (62.6 kg), SpO2 97%.   General: No apparent distress alert and oriented x3 mood and affect normal, dressed appropriately.  HEENT: Pupils equal, extraocular movements intact  Respiratory: Patient's speak in full sentences and does not appear short of breath  Cardiovascular: No lower extremity edema, non tender, no erythema  Gait MSK:  Back does have some loss of lordosis noted.  Some tenderness to palpation in the paraspinal musculature.  Pain more over the left sacroiliac joint.  Negative straight leg test noted today.  Tightness in the right hip flexor  Left ankle shows significant overpronation of the hindfoot with patient having insufficiency of the posterior tibialis tendon.  Patient does not seem to invert towards going home her toes.  Osteopathic findings  C2 flexed rotated and side bent right C6 flexed rotated and side bent left T3 extended rotated and side bent right inhaled rib T9 extended rotated and side bent left L2 flexed rotated and side bent right L5 flexed rotated and side bent right Sacrum left on left   [id number]; 15 additional minutes spent for Therapeutic exercises as stated in above notes.  This included exercises focusing on stretching, strengthening, with significant focus on eccentric aspects.   Long term goals include an improvement in range of motion, strength, endurance as well as avoiding reinjury. Patient's frequency would include in 1-2 times a day, 3-5 times a week  for a duration of 6-12 weeks. Ankle strengthening that included:  Basic range of motion exercises to allow proper full motion at ankle Stretching of the lower leg and hamstrings  Theraband exercises for the lower leg - inversion, eversion, dorsiflexion and plantarflexion each to be completed with a theraband Balance exercises to increase proprioception Weight bearing exercises to increase strength and  balance  Proper technique shown and discussed handout in great detail with ATC.  All questions were discussed and answered.      Assessment and Plan:  Chronic left SI joint pain Tightness noted again today.  Could be secondary to some insufficiency of the posterior tibialis.  Will start with some exercises that I am hopeful that will be more beneficial.  Increase activity slowly otherwise.  Discussed icing regimen of home exercises.  Increase activity slowly.  Follow-up with me again in 6 to 12 weeks    Nonallopathic problems  Decision today to treat with OMT was based on Physical Exam  After verbal consent patient was treated with HVLA, ME, FPR techniques in cervical, rib, thoracic, lumbar, and sacral  areas  Patient tolerated the procedure well with improvement in symptoms  Patient given exercises, stretches and lifestyle modifications  See medications in patient instructions if given  Patient will follow up in 4-8 weeks    The above documentation has been reviewed and is accurate and complete Ashley Tolson M Kensie Susman, DO          Note: This dictation was prepared with Dragon dictation along with smaller phrase technology. Any transcriptional errors that result from this process are unintentional.

## 2024-02-11 ENCOUNTER — Encounter: Payer: Self-pay | Admitting: Family Medicine

## 2024-02-11 ENCOUNTER — Ambulatory Visit: Admitting: Family Medicine

## 2024-02-11 VITALS — BP 110/70 | HR 70 | Ht 63.0 in | Wt 138.0 lb

## 2024-02-11 DIAGNOSIS — M76822 Posterior tibial tendinitis, left leg: Secondary | ICD-10-CM

## 2024-02-11 DIAGNOSIS — M533 Sacrococcygeal disorders, not elsewhere classified: Secondary | ICD-10-CM

## 2024-02-11 DIAGNOSIS — M9908 Segmental and somatic dysfunction of rib cage: Secondary | ICD-10-CM

## 2024-02-11 DIAGNOSIS — M9903 Segmental and somatic dysfunction of lumbar region: Secondary | ICD-10-CM

## 2024-02-11 DIAGNOSIS — M9901 Segmental and somatic dysfunction of cervical region: Secondary | ICD-10-CM | POA: Diagnosis not present

## 2024-02-11 DIAGNOSIS — M9902 Segmental and somatic dysfunction of thoracic region: Secondary | ICD-10-CM

## 2024-02-11 DIAGNOSIS — G8929 Other chronic pain: Secondary | ICD-10-CM | POA: Diagnosis not present

## 2024-02-11 DIAGNOSIS — M9904 Segmental and somatic dysfunction of sacral region: Secondary | ICD-10-CM

## 2024-02-11 NOTE — Assessment & Plan Note (Signed)
 Tightness noted again today.  Could be secondary to some insufficiency of the posterior tibialis.  Will start with some exercises that I am hopeful that will be more beneficial.  Increase activity slowly otherwise.  Discussed icing regimen of home exercises.  Increase activity slowly.  Follow-up with me again in 6 to 12 weeks

## 2024-02-11 NOTE — Assessment & Plan Note (Signed)
 Discussed recovery sandals, over-the-counter orthotics but patient does have custom orthotics.  We discussed with patient about icing regimen, discussed the strengthening of the posterior tibialis and how this can give more stability and may take pressure off of the sacroiliac joint.  Follow-up again in 6 to 8 weeks

## 2024-02-11 NOTE — Patient Instructions (Signed)
 Good to see you Posterior tib exercises OOFOS or HOKA recovery sandals in the house See me again in 2-3 months

## 2024-04-07 ENCOUNTER — Ambulatory Visit: Admitting: Family Medicine

## 2024-04-12 ENCOUNTER — Ambulatory Visit: Admitting: Family Medicine

## 2024-04-26 NOTE — Progress Notes (Unsigned)
 [facility] [address] [place] [address] Phone: [phone] Subjective:   Ashley Barajas, am serving as a scribe for Dr. Arthea Claudene.  I'm seeing this patient by the request  of:  Geofm Glade PARAS, MD  CC: Back and neck pain follow-up  YEP:Dlagzrupcz  Ashley Barajas is a 43 y.o. female coming in with complaint of back and neck pain. OMT 02/11/2024. Patient states that she is having R hip, L knee and L foot pain. Hip pain wakes her up at night. Pain moves around the joint. Feels like IR is limited and she does have groin pain intermittently.   Pain over lateral aspect of L knee. Pain worse after sitting for a while. Wakes up at night in pain. Less pain with activity.   Foot pain over top of foot that is constant. No worse than last visit.   Medications patient has been prescribed: None  Taking:         Reviewed prior external information including notes and imaging from previsou exam, outside providers and external EMR if available.   As well as notes that were available from care everywhere and other healthcare systems.  Past medical history, social, surgical and family history all reviewed in electronic medical record.  No pertanent information unless stated regarding to the chief complaint.   Past Medical History:  Diagnosis Date   Amenorrhea, unspecified    states no menses in 9 yrs.   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 PAIN, JOINT PAIN, FATIGUE, CHRONIC CONSTIPATION     Review of Systems:  No headache, visual changes, nausea, vomiting, diarrhea, constipation, dizziness, abdominal pain, skin rash, fevers, chills, night sweats, weight loss,  swollen lymph nodes,  chest pain, shortness of breath, mood changes. POSITIVE muscle aches, body aches, joint discomfort and sometimes swelling  Objective  Blood pressure 128/82, pulse (!) 104, height 5' 3 (1.6 m), SpO2 98%.   General: No apparent distress alert and oriented x3 mood and affect normal, dressed appropriately.  HEENT: Pupils equal, extraocular movements intact  Respiratory: Patient's speak in full sentences and does not appear short of breath  Cardiovascular: No lower extremity edema, non tender, no erythema  Gait relatively normal MSK:  Back does have some loss lordosis noted.  Some tenderness to palpation in the paraspinal musculature.  Pain is unfortunately out of proportion to the amount of palpation.  Osteopathic findings  C3 flexed rotated and side bent right C7 flexed rotated and side bent left T3 extended rotated and side bent right inhaled rib T7 extended rotated and side bent left L2 flexed rotated and side bent right Sacrum right on right       Assessment and Plan:  Low back pain Continues to have pain that is out of proportion, will get laboratory workup to rule out autoimmune diseases, discussed other types of treatment options that are available but would be more medications.  Patient could be potentially overtraining and we discussed different types of training that I think would be more beneficial for this individual.  Discussed icing regimen and home exercises, increase activity slowly.  Follow-up with me again in 6 to 8 weeks otherwise.    Nonallopathic problems  Decision today to treat with OMT was based on Physical Exam  After verbal consent patient was treated with HVLA, ME, FPR techniques in cervical, rib, thoracic, lumbar, and sacral  areas  Patient tolerated the procedure well with improvement in symptoms  Patient given exercises, stretches and lifestyle modifications  See medications in patient instructions if given  Patient will  follow up in 4-8 weeks    The above documentation has been reviewed and is accurate and complete Ashley Leventhal M [name], DO          Note: This dictation was prepared with Dragon dictation along with smaller phrase technology. Any transcriptional errors that result from this process are unintentional.

## 2024-04-27 ENCOUNTER — Ambulatory Visit

## 2024-04-27 ENCOUNTER — Encounter: Payer: Self-pay | Admitting: Family Medicine

## 2024-04-27 ENCOUNTER — Ambulatory Visit: Admitting: Family Medicine

## 2024-04-27 VITALS — BP 128/82 | HR 104 | Ht 63.0 in

## 2024-04-27 DIAGNOSIS — G8929 Other chronic pain: Secondary | ICD-10-CM

## 2024-04-27 DIAGNOSIS — M9908 Segmental and somatic dysfunction of rib cage: Secondary | ICD-10-CM

## 2024-04-27 DIAGNOSIS — M79672 Pain in left foot: Secondary | ICD-10-CM | POA: Diagnosis not present

## 2024-04-27 DIAGNOSIS — M9903 Segmental and somatic dysfunction of lumbar region: Secondary | ICD-10-CM

## 2024-04-27 DIAGNOSIS — M545 Low back pain, unspecified: Secondary | ICD-10-CM

## 2024-04-27 DIAGNOSIS — M25551 Pain in right hip: Secondary | ICD-10-CM | POA: Diagnosis not present

## 2024-04-27 DIAGNOSIS — M25562 Pain in left knee: Secondary | ICD-10-CM

## 2024-04-27 DIAGNOSIS — M9904 Segmental and somatic dysfunction of sacral region: Secondary | ICD-10-CM

## 2024-04-27 DIAGNOSIS — R5383 Other fatigue: Secondary | ICD-10-CM | POA: Diagnosis not present

## 2024-04-27 DIAGNOSIS — M9902 Segmental and somatic dysfunction of thoracic region: Secondary | ICD-10-CM

## 2024-04-27 DIAGNOSIS — M9901 Segmental and somatic dysfunction of cervical region: Secondary | ICD-10-CM

## 2024-04-27 LAB — CBC WITH DIFFERENTIAL/PLATELET
Basophils Absolute: 0 K/uL (ref 0.0–0.1)
Basophils Relative: 0.8 % (ref 0.0–3.0)
Eosinophils Absolute: 0.1 K/uL (ref 0.0–0.7)
Eosinophils Relative: 3.3 % (ref 0.0–5.0)
HCT: 38.9 % (ref 36.0–46.0)
Hemoglobin: 12.8 g/dL (ref 12.0–15.0)
Lymphocytes Relative: 35.5 % (ref 12.0–46.0)
Lymphs Abs: 1.6 K/uL (ref 0.7–4.0)
MCHC: 32.9 g/dL (ref 30.0–36.0)
MCV: 88.7 fl (ref 78.0–100.0)
Monocytes Absolute: 0.2 K/uL (ref 0.1–1.0)
Monocytes Relative: 5.7 % (ref 3.0–12.0)
Neutro Abs: 2.4 K/uL (ref 1.4–7.7)
Neutrophils Relative %: 54.7 % (ref 43.0–77.0)
Platelets: 173 K/uL (ref 150.0–400.0)
RBC: 4.39 Mil/uL (ref 3.87–5.11)
RDW: 13.1 % (ref 11.5–15.5)
WBC: 4.4 K/uL (ref 4.0–10.5)

## 2024-04-27 LAB — COMPREHENSIVE METABOLIC PANEL WITH GFR
ALT: 18 U/L (ref 0–35)
AST: 22 U/L (ref 0–37)
Albumin: 4.6 g/dL (ref 3.5–5.2)
Alkaline Phosphatase: 35 U/L — ABNORMAL LOW (ref 39–117)
BUN: 23 mg/dL (ref 6–23)
CO2: 30 meq/L (ref 19–32)
Calcium: 9.5 mg/dL (ref 8.4–10.5)
Chloride: 102 meq/L (ref 96–112)
Creatinine, Ser: 0.81 mg/dL (ref 0.40–1.20)
GFR: 90.22 mL/min (ref >60.00)
Glucose, Bld: 77 mg/dL (ref 70–99)
Potassium: 3.9 meq/L (ref 3.5–5.1)
Sodium: 137 meq/L (ref 135–145)
Total Bilirubin: 0.5 mg/dL (ref 0.2–1.2)
Total Protein: 7.5 g/dL (ref 6.0–8.3)

## 2024-04-27 LAB — FERRITIN: Ferritin: 46.3 ng/mL (ref 10.0–291.0)

## 2024-04-27 LAB — IBC PANEL
Iron: 109 ug/dL (ref 42–145)
Saturation Ratios: 37.6 % (ref 20.0–50.0)
TIBC: 289.8 ug/dL (ref 250.0–450.0)
Transferrin: 207 mg/dL — ABNORMAL LOW (ref 212.0–360.0)

## 2024-04-27 LAB — VITAMIN B12: Vitamin B-12: 753 pg/mL (ref 211–911)

## 2024-04-27 LAB — TSH: TSH: 0.87 u[IU]/mL (ref 0.35–5.50)

## 2024-04-27 LAB — VITAMIN D 25 HYDROXY (VIT D DEFICIENCY, FRACTURES): VITD: 75.98 ng/mL (ref 30.00–100.00)

## 2024-04-27 LAB — C-REACTIVE PROTEIN: CRP: <0.5 mg/dL (ref 0.5–20.0)

## 2024-04-27 LAB — SEDIMENTATION RATE: Sed Rate: 5 mm/h (ref 0–20)

## 2024-04-27 LAB — URIC ACID: Uric Acid, Serum: 5 mg/dL (ref 2.4–7.0)

## 2024-04-27 NOTE — Patient Instructions (Addendum)
 Xray today Labs today Stretch hip flexor Try lava See me again in 2 months

## 2024-04-27 NOTE — Assessment & Plan Note (Signed)
 Continues to have pain that is out of proportion, will get laboratory workup to rule out autoimmune diseases, discussed other types of treatment options that are available but would be more medications.  Patient could be potentially overtraining and we discussed different types of training that I think would be more beneficial for this individual.  Discussed icing regimen and home exercises, increase activity slowly.  Follow-up with me again in 6 to 8 weeks otherwise.

## 2024-04-29 LAB — CYCLIC CITRUL PEPTIDE ANTIBODY, IGG: Cyclic Citrullin Peptide Ab: <16 U

## 2024-04-29 LAB — RHEUMATOID FACTOR: Rheumatoid fact SerPl-aCnc: <10 [IU]/mL (ref <14)

## 2024-04-29 LAB — ANGIOTENSIN CONVERTING ENZYME: Angiotensin-Converting Enzyme: 57 U/L (ref 9–67)

## 2024-04-29 LAB — ANA: Anti Nuclear Antibody (ANA): NEGATIVE

## 2024-04-29 LAB — PTH, INTACT AND CALCIUM
Calcium: 9.7 mg/dL (ref 8.6–10.2)
PTH: 8 pg/mL — ABNORMAL LOW (ref 16–77)

## 2024-04-29 LAB — CALCIUM, IONIZED: Calcium, Ion: 5.3 mg/dL (ref 4.7–5.5)

## 2024-05-02 ENCOUNTER — Ambulatory Visit: Payer: Self-pay | Admitting: Family Medicine

## 2024-06-22 NOTE — Progress Notes (Deleted)
°  [name] [address] [place] [address] Phone: [phone] Subjective:    I'm seeing this patient by the request  of:  Geofm Glade PARAS, MD  CC:   YEP:Dlagzrupcz  Ashley Barajas is a 43 y.o. female coming in with complaint of back and neck pain. OMT 04/27/2024. Also f/u for L foot pain. (-) foot, knee and hip xray last visit. Patient states   Medications patient has been prescribed: None  Taking:         Reviewed prior external information including notes and imaging from previsou exam, outside providers and external EMR if available.   As well as notes that were available from care everywhere and other healthcare systems.  Past medical history, social, surgical and family history all reviewed in electronic medical record.  No pertanent information unless stated regarding to the chief complaint.   Past Medical History:  Diagnosis Date   Amenorrhea, unspecified    states no menses in 9 yrs.   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 PAIN, JOINT PAIN, FATIGUE, CHRONIC CONSTIPATION     Review of Systems:  No headache, visual changes, nausea, vomiting, diarrhea, constipation, dizziness, abdominal pain, skin rash, fevers, chills, night sweats, weight loss, swollen lymph nodes, body aches, joint swelling, chest pain, shortness of breath, mood changes. POSITIVE muscle aches  Objective  There were no vitals taken for this visit.   General: No apparent distress alert and oriented x3 mood and affect normal, dressed appropriately.  HEENT: Pupils equal, extraocular movements intact  Respiratory: Patient's speak in full sentences and does not appear short of  breath  Cardiovascular: No lower extremity edema, non tender, no erythema  Gait MSK:  Back   Osteopathic findings  C2 flexed rotated and side bent right C6 flexed rotated and side bent left T3 extended rotated and side bent right inhaled rib T9 extended rotated and side bent left L2 flexed rotated and side bent right Sacrum right on right       Assessment and Plan:  No problem-specific Assessment & Plan notes found for this encounter.    Nonallopathic problems  Decision today to treat with OMT was based on Physical Exam  After verbal consent patient was treated with HVLA, ME, FPR techniques in cervical, rib, thoracic, lumbar, and sacral  areas  Patient tolerated the procedure well with improvement in symptoms  Patient given exercises, stretches and lifestyle modifications  See medications in patient instructions if given  Patient will follow up in 4-8 weeks             Note: This dictation was prepared with Dragon dictation along with smaller phrase technology. Any transcriptional errors that result from this process are unintentional.

## 2024-06-24 ENCOUNTER — Ambulatory Visit: Admitting: Family Medicine

## 2024-07-05 NOTE — Progress Notes (Deleted)
°  [name] [address] [place] [address] Phone: [phone] Subjective:    I'm seeing this patient by the request  of:  Geofm Glade PARAS, MD  CC:   YEP:Dlagzrupcz  LARON ANGELINI is a 43 y.o. female coming in with complaint of back and neck pain. OMT 04/27/2024. Also f/u for L foot pain. (-) foot, knee and hip xray last visit. Patient states   Medications patient has been prescribed: None  Taking:         Reviewed prior external information including notes and imaging from previsou exam, outside providers and external EMR if available.   As well as notes that were available from care everywhere and other healthcare systems.  Past medical history, social, surgical and family history all reviewed in electronic medical record.  No pertanent information unless stated regarding to the chief complaint.   Past Medical History:  Diagnosis Date   Amenorrhea, unspecified    states no menses in 9 yrs.   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 PAIN, JOINT PAIN, FATIGUE, CHRONIC CONSTIPATION     Review of Systems:  No headache, visual changes, nausea, vomiting, diarrhea, constipation, dizziness, abdominal pain, skin rash, fevers, chills, night sweats, weight loss, swollen lymph nodes, body aches, joint swelling, chest pain, shortness of breath, mood changes. POSITIVE muscle aches  Objective  There were no vitals taken for this visit.   General: No apparent distress alert and oriented x3 mood and affect normal, dressed appropriately.  HEENT: Pupils equal, extraocular movements intact  Respiratory: Patient's speak in full sentences and does not appear short of  breath  Cardiovascular: No lower extremity edema, non tender, no erythema  Gait MSK:  Back   Osteopathic findings  C2 flexed rotated and side bent right C6 flexed rotated and side bent left T3 extended rotated and side bent right inhaled rib T9 extended rotated and side bent left L2 flexed rotated and side bent right Sacrum right on right       Assessment and Plan:  No problem-specific Assessment & Plan notes found for this encounter.    Nonallopathic problems  Decision today to treat with OMT was based on Physical Exam  After verbal consent patient was treated with HVLA, ME, FPR techniques in cervical, rib, thoracic, lumbar, and sacral  areas  Patient tolerated the procedure well with improvement in symptoms  Patient given exercises, stretches and lifestyle modifications  See medications in patient instructions if given  Patient will follow up in 4-8 weeks             Note: This dictation was prepared with Dragon dictation along with smaller phrase technology. Any transcriptional errors that result from this process are unintentional.

## 2024-07-10 ENCOUNTER — Telehealth: Admitting: Family

## 2024-07-10 DIAGNOSIS — J019 Acute sinusitis, unspecified: Secondary | ICD-10-CM

## 2024-07-10 MED ORDER — AMOXICILLIN-POT CLAVULANATE 875-125 MG PO TABS: 1.0000 | ORAL_TABLET | Freq: Two times a day (BID) | ORAL | 0 refills | Status: AC

## 2024-07-10 NOTE — Progress Notes (Signed)

## 2024-07-11 ENCOUNTER — Ambulatory Visit: Admitting: Family Medicine

## 2024-08-02 NOTE — Progress Notes (Unsigned)
 " [facility] [address] [place] [address] Phone: [phone] Subjective:   LILLETTE Berwyn Posey, am serving as a scribe for Dr. Arthea Claudene.  I'm seeing this patient by the request  of:  Geofm Glade PARAS, MD  CC: Worsening right hip pain  YEP:Dlagzrupcz  Ashley Barajas is a 44 y.o. female coming in with complaint of back and neck pain. OMT 04/27/2024. Also f/u for L foot pain. (-) foot, knee and hip xray last visit. Patient states that her foot and knee are doing better. Back is still the same as last visit.   Medications patient has been prescribed: None  Taking:         Reviewed prior external information including notes and imaging from previsou exam, outside providers and external EMR if available.   As well as notes that were available from care everywhere and other healthcare systems.  Past medical history, social, surgical and family history all reviewed in electronic medical record.  No pertanent information unless stated regarding to the chief complaint.   Past Medical History:  Diagnosis Date   Amenorrhea, unspecified    states no menses in 9 yrs.   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 PAIN, JOINT PAIN, FATIGUE, CHRONIC CONSTIPATION     Review of Systems:  No headache, visual changes, nausea, vomiting, diarrhea, constipation, dizziness, abdominal pain, skin rash, fevers, chills, night sweats, weight loss, swollen lymph nodes, body aches, joint swelling, chest pain, shortness of breath, mood changes. POSITIVE muscle aches  Objective  There were no vitals taken for this visit.   General: No apparent distress alert and oriented x3 mood and  affect normal, dressed appropriately.  HEENT: Pupils equal, extraocular movements intact  Respiratory: Patient's speak in full sentences and does not appear short of breath  Cardiovascular: No lower extremity edema, non tender, no erythema  Low back does have some loss of lordosis noted.  Patient's right hip though is significantly more tender than usual.  Worsening internal range of motion.  Positive grind test noted.  Negative fulcrum. Antalgic gait noted as well. Osteopathic findings  C3 flexed rotated and side bent right C5 flexed rotated and side bent left T3 extended rotated and side bent right inhaled rib T9 extended rotated and side bent left L2 flexed rotated and side bent right Sacrum right on right Significant tightness of right groin pelvic shear      Assessment and Plan:  Right hip pain Right hip is worsening, was found to have likely an anterior labral tear previously.  Now having worsening discomfort and pain again.  Has been on and off but increasing in frequency and pain over the last 10 years.  At this point now affecting daily activities.  Can even wake her up at night.  Will get advanced imaging including this tendon and then to further evaluate the with patient would be willing to potentially consider surgical intervention.  Has failed physical therapy, home exercises, and oral anti-inflammatories.  Follow-up with me again in 6 to 12 weeks.      Nonallopathic problems  Decision today to treat with  OMT was based on Physical Exam  After verbal consent patient was treated with  ME, FPR techniques in cervical, rib, thoracic, lumbar, and sacral  areas avoided HVLA secondary to the amount of tenderness of patient's hip.  Patient tolerated the procedure well with improvement in symptoms  Patient given exercises, stretches and lifestyle modifications  See medications in patient instructions if given  Patient will follow up in 4-8 weeks     The above  documentation has been reviewed and is accurate and complete Troyce Febo M Mckayla Mulcahey, DO         Note: This dictation was prepared with Dragon dictation along with smaller phrase technology. Any transcriptional errors that result from this process are unintentional.         "

## 2024-08-03 ENCOUNTER — Ambulatory Visit

## 2024-08-03 ENCOUNTER — Encounter: Payer: Self-pay | Admitting: Family Medicine

## 2024-08-03 ENCOUNTER — Ambulatory Visit: Admitting: Family Medicine

## 2024-08-03 VITALS — BP 112/84 | HR 65 | Ht 63.0 in | Wt 142.0 lb

## 2024-08-03 DIAGNOSIS — M9902 Segmental and somatic dysfunction of thoracic region: Secondary | ICD-10-CM | POA: Diagnosis not present

## 2024-08-03 DIAGNOSIS — M25551 Pain in right hip: Secondary | ICD-10-CM | POA: Diagnosis not present

## 2024-08-03 DIAGNOSIS — M9901 Segmental and somatic dysfunction of cervical region: Secondary | ICD-10-CM

## 2024-08-03 DIAGNOSIS — M9908 Segmental and somatic dysfunction of rib cage: Secondary | ICD-10-CM | POA: Diagnosis not present

## 2024-08-03 DIAGNOSIS — M9903 Segmental and somatic dysfunction of lumbar region: Secondary | ICD-10-CM | POA: Diagnosis not present

## 2024-08-03 DIAGNOSIS — M545 Low back pain, unspecified: Secondary | ICD-10-CM

## 2024-08-03 DIAGNOSIS — M9904 Segmental and somatic dysfunction of sacral region: Secondary | ICD-10-CM

## 2024-08-03 NOTE — Assessment & Plan Note (Signed)
 Multifactorial with likely worsening pain secondary to discomfort.  Discussed icing regimen and home exercises, discussed which activities to do and which ones to avoid.  Increase activity slowly.  Follow-up 2 manipulation.

## 2024-08-03 NOTE — Assessment & Plan Note (Signed)
 Right hip is worsening, was found to have likely an anterior labral tear previously.  Now having worsening discomfort and pain again.  Has been on and off but increasing in frequency and pain over the last 10 years.  At this point now affecting daily activities.  Can even wake her up at night.  Will get advanced imaging including this tendon and then to further evaluate the with patient would be willing to potentially consider surgical intervention.  Has failed physical therapy, home exercises, and oral anti-inflammatories.  Follow-up with me again in 6 to 12 weeks.

## 2024-08-03 NOTE — Patient Instructions (Addendum)
 R hip xray MRA R hip See me again in 6-8 weeks

## 2024-08-08 ENCOUNTER — Ambulatory Visit: Payer: Self-pay | Admitting: Family Medicine

## 2024-08-11 ENCOUNTER — Ambulatory Visit: Admitting: Family Medicine

## 2024-08-22 ENCOUNTER — Ambulatory Visit: Admitting: Family Medicine

## 2024-09-07 ENCOUNTER — Other Ambulatory Visit

## 2024-09-19 ENCOUNTER — Ambulatory Visit: Admitting: Family Medicine
